# Patient Record
Sex: Male | Born: 1965 | Race: White | Hispanic: No | Marital: Married | Smoking: Current every day smoker
Health system: Southern US, Community
[De-identification: ages and names within clinical notes are randomized; demographics above are authoritative.]

## PROBLEM LIST (undated history)

## (undated) DIAGNOSIS — I6529 Occlusion and stenosis of unspecified carotid artery: Secondary | ICD-10-CM

## (undated) DIAGNOSIS — E7800 Pure hypercholesterolemia, unspecified: Secondary | ICD-10-CM

## (undated) DIAGNOSIS — Z87442 Personal history of urinary calculi: Secondary | ICD-10-CM

## (undated) DIAGNOSIS — M1040 Other secondary gout, unspecified site: Secondary | ICD-10-CM

## (undated) DIAGNOSIS — M4802 Spinal stenosis, cervical region: Secondary | ICD-10-CM

## (undated) DIAGNOSIS — N210 Calculus in bladder: Secondary | ICD-10-CM

## (undated) DIAGNOSIS — Z720 Tobacco use: Secondary | ICD-10-CM

## (undated) HISTORY — PX: TONSILLECTOMY: SUR1361

---

## 2007-08-08 ENCOUNTER — Ambulatory Visit: Payer: Self-pay | Admitting: Orthopedic Surgery

## 2010-01-18 ENCOUNTER — Ambulatory Visit: Payer: Self-pay | Admitting: Internal Medicine

## 2010-01-19 ENCOUNTER — Ambulatory Visit: Payer: Self-pay | Admitting: Urology

## 2010-01-20 ENCOUNTER — Ambulatory Visit: Payer: Self-pay | Admitting: Urology

## 2012-02-22 IMAGING — CT CT ABD-PELV W/O CM
1 of 2 series · 15 of 32 positions shown, 19 images · non-contrast
Comparison: none

REASON FOR EXAM: CR [id number]  left flank pain vomiting hematuria
COMMENTS:

PROCEDURE:     KCT - KCT ABDOMEN/PELVIS WO  - January 18, 2010  [DATE]
RESULT:     CT abdomen and pelvis dated 01/18/2010
TECHNIQUE: Helical noncontrasted 3 mm sections were obtained from the lung
bases through the pubic symphysis.

[Series 2: stone 3.0 i40f · axial · 0.76mm/px · z∈[-1193,-797]mm · 15 of 150 slices shown, 19 images]
[im 12/150  soft-tissue]
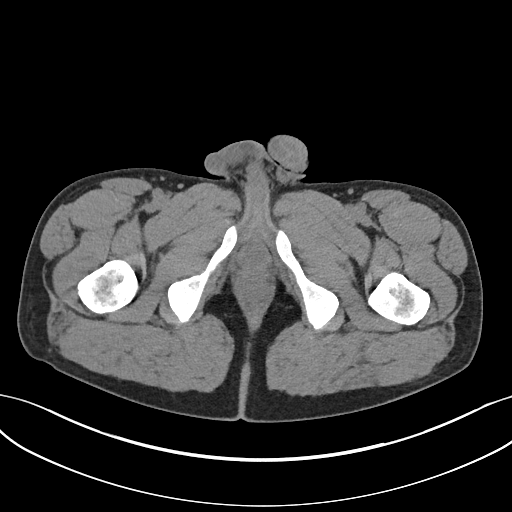
[im 12/150  bone]
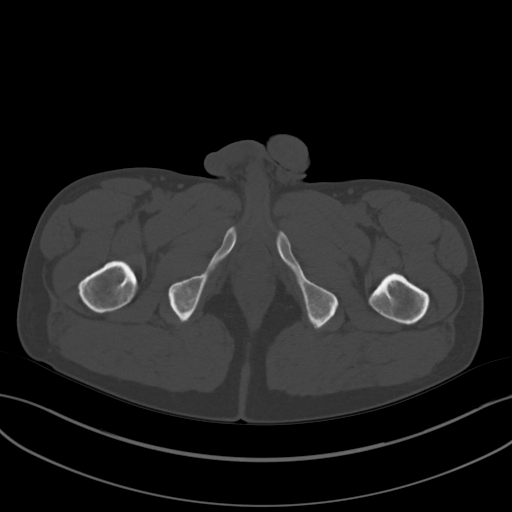
[im 23/150  soft-tissue]
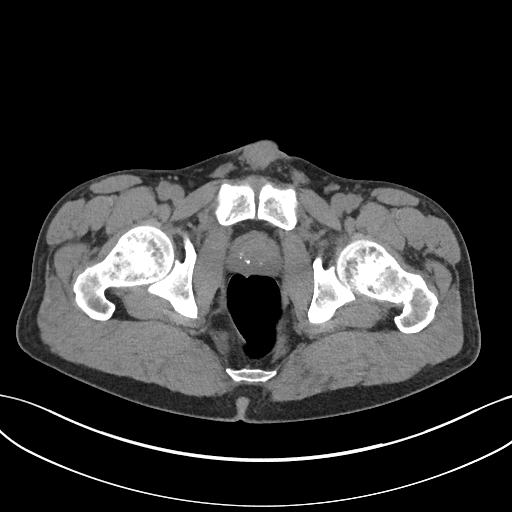
[im 34/150  soft-tissue]
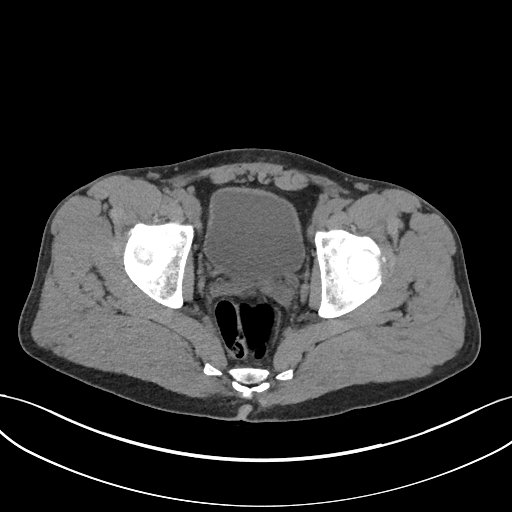
[im 45/150  soft-tissue]
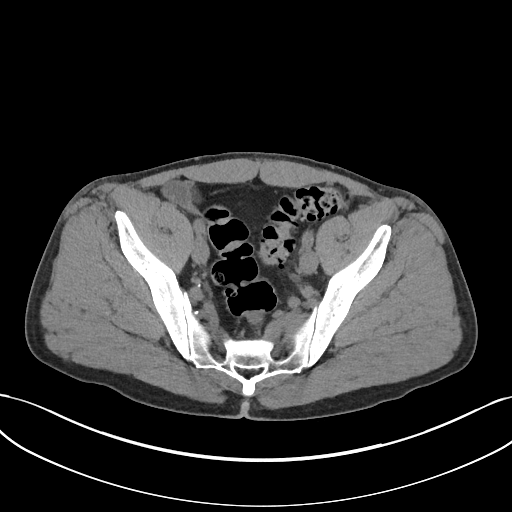
[im 56/150  soft-tissue]
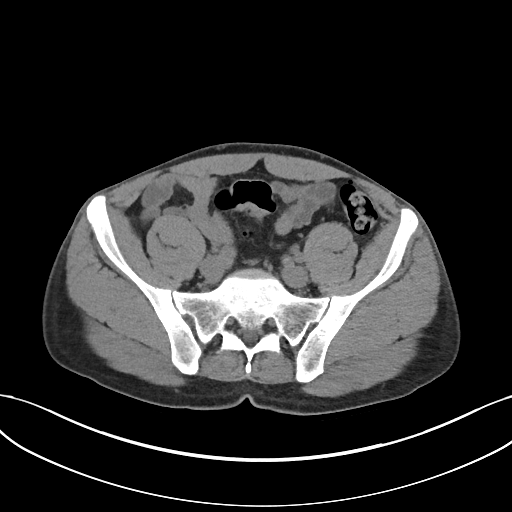
[im 67/150  soft-tissue]
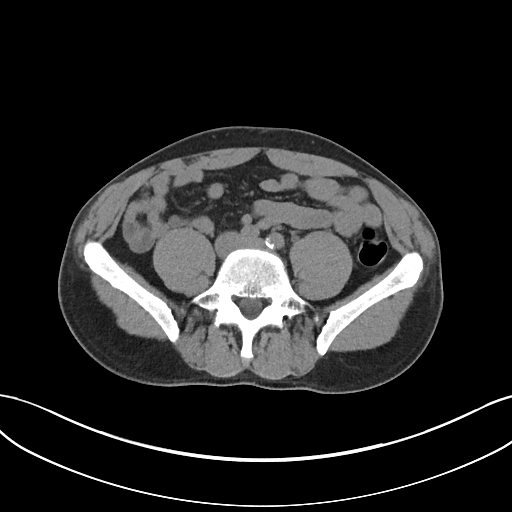
[im 78/150  soft-tissue]
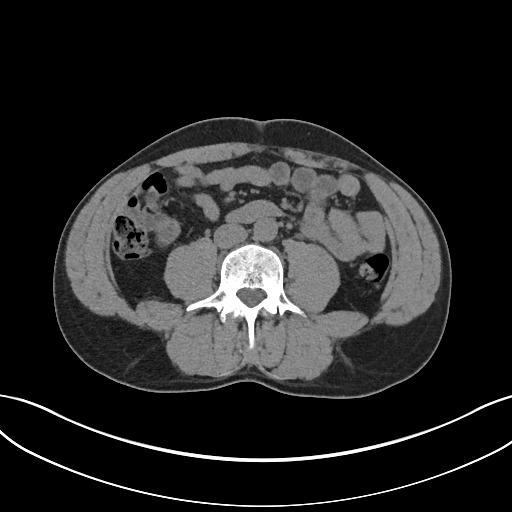
[im 89/150  soft-tissue]
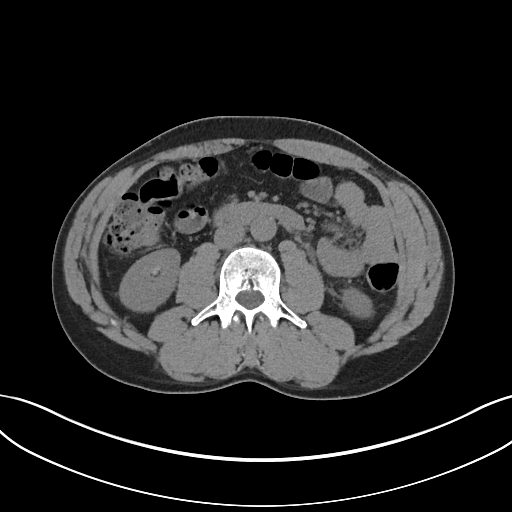
[im 100/150  soft-tissue]
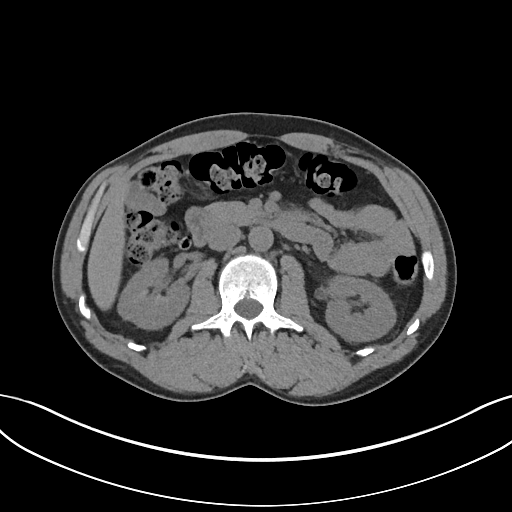
[im 100/150  bone]
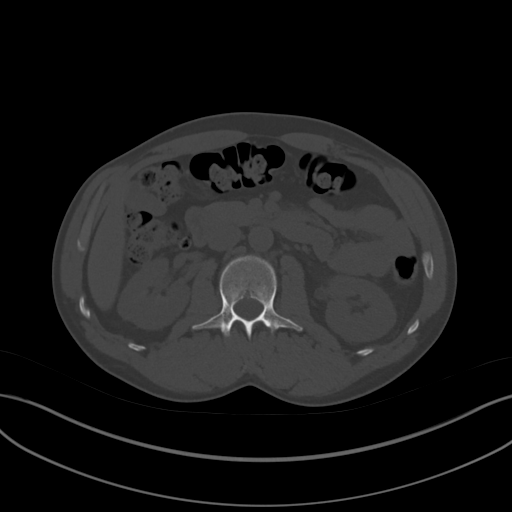
[im 111/150  soft-tissue]
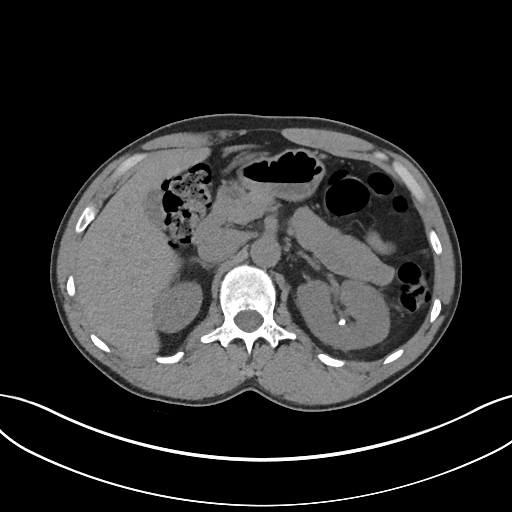
[im 122/150  soft-tissue]
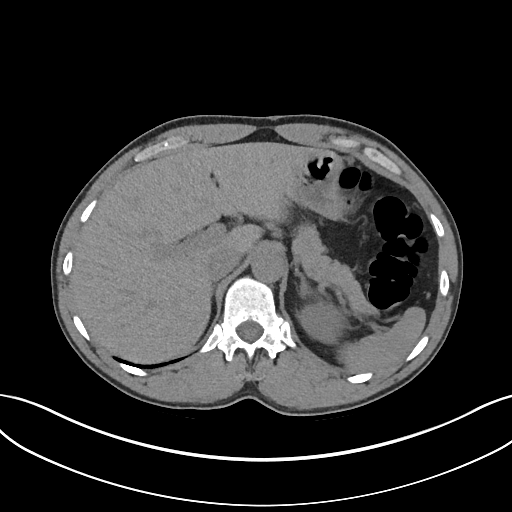
[im 127/150  lung]
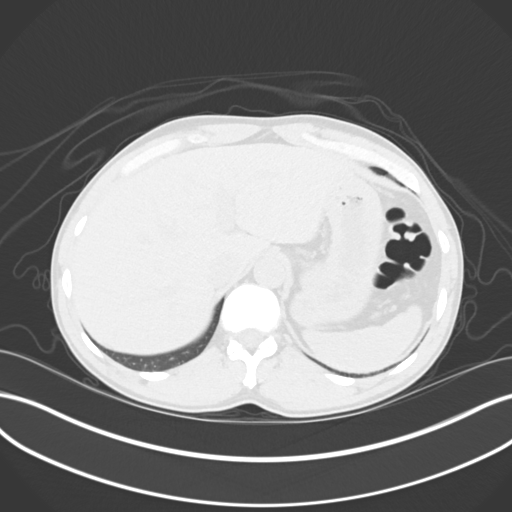
[im 133/150  soft-tissue]
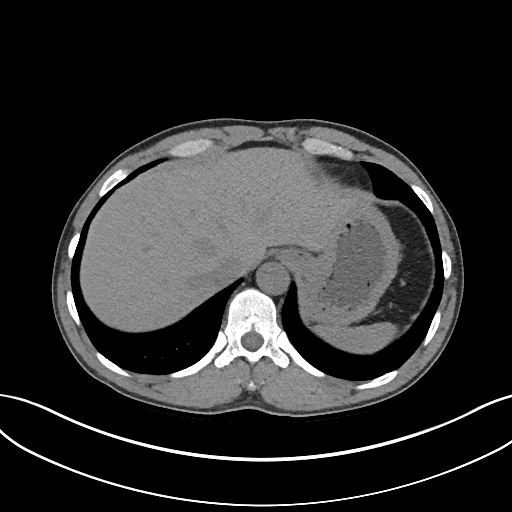
[im 133/150  lung]
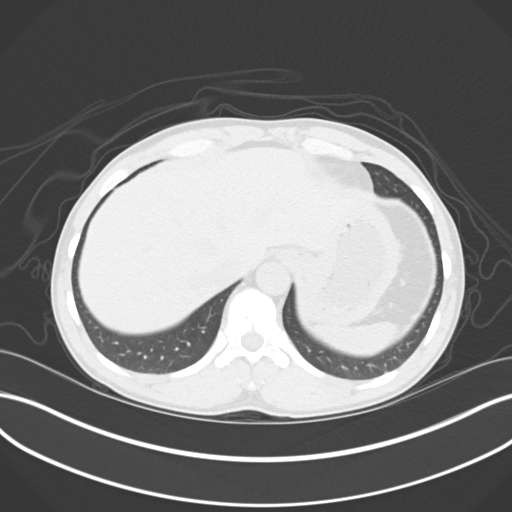
[im 138/150  lung]
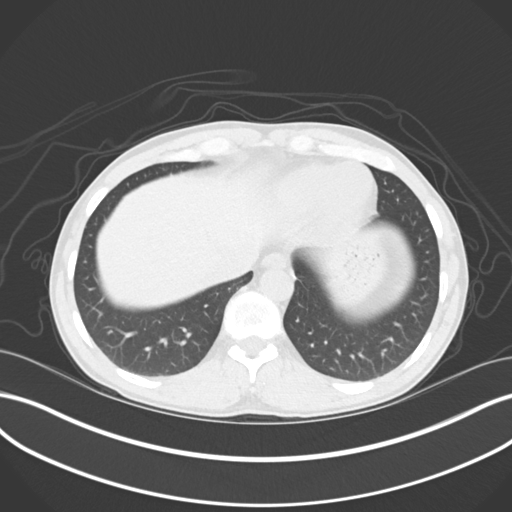
[im 144/150  soft-tissue]
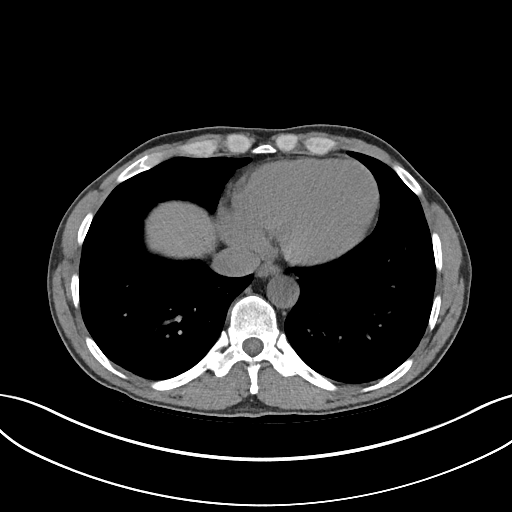
[im 144/150  lung]
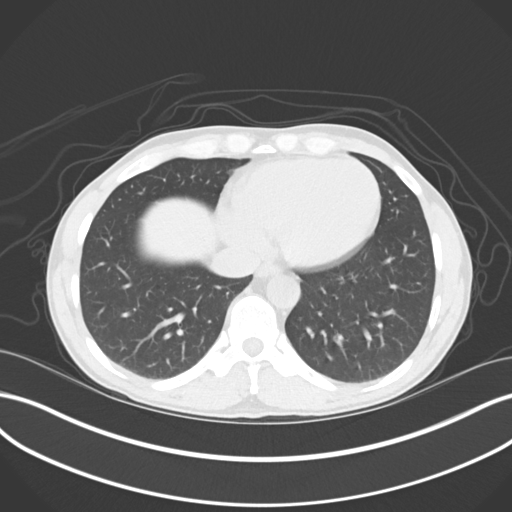

[15 of 32 positions shown; findings below may reference images not displayed]

FINDINGS: The lung bases are grossly unremarkable.

Evaluation of the left kidney demonstrates multiple nonobstructing medullary
calculi the largest measuring 6.9 mm. Mild pelviectasis is identified as
well as mild hydroureter of the proximal ureter. Within the proximal ureter
an 8.1 mm calculus is identified. Multiple nonobstructing medullary calculi
identified within the right kidney evidence of hydronephrosis hydroureter
nor ureteral lithiasis.

Noncontrast evaluation of the liver, spleen, adrenals, pancreas is
unremarkable. There is no CT evidence of bowel obstruction nor secondary
signs reflecting enteritis, colitis common diverticulitis nor appendicitis.
Within the central base of the urinary bladder 5.3 mm calculus is identified.

There is no CT evidence of an abdominal aortic aneurysm.
IMPRESSION: Proximal left ureteral calculus which appears be just
distal to the ureteral pelvic junction measuring 8.1 mm in diameter with
associated mild obstructive uropathy.
2. Nonobstructing bilateral medullary calculi.

## 2012-02-23 IMAGING — CR DG ABDOMEN 1V
1 series · 2 of 2 positions shown · non-contrast
Comparison: none

REASON FOR EXAM: calculus of ureter
COMMENTS:

PROCEDURE:     MDR - MDR KIDNEY URETER BLADDER  - January 19, 2010 [DATE]
RESULT:     Comparison: CT of the abdomen and pelvis 01/18/2010

[Series 1: view not recorded · 0.17mm/px · 2 of 2 slices shown]
[im 1/2]
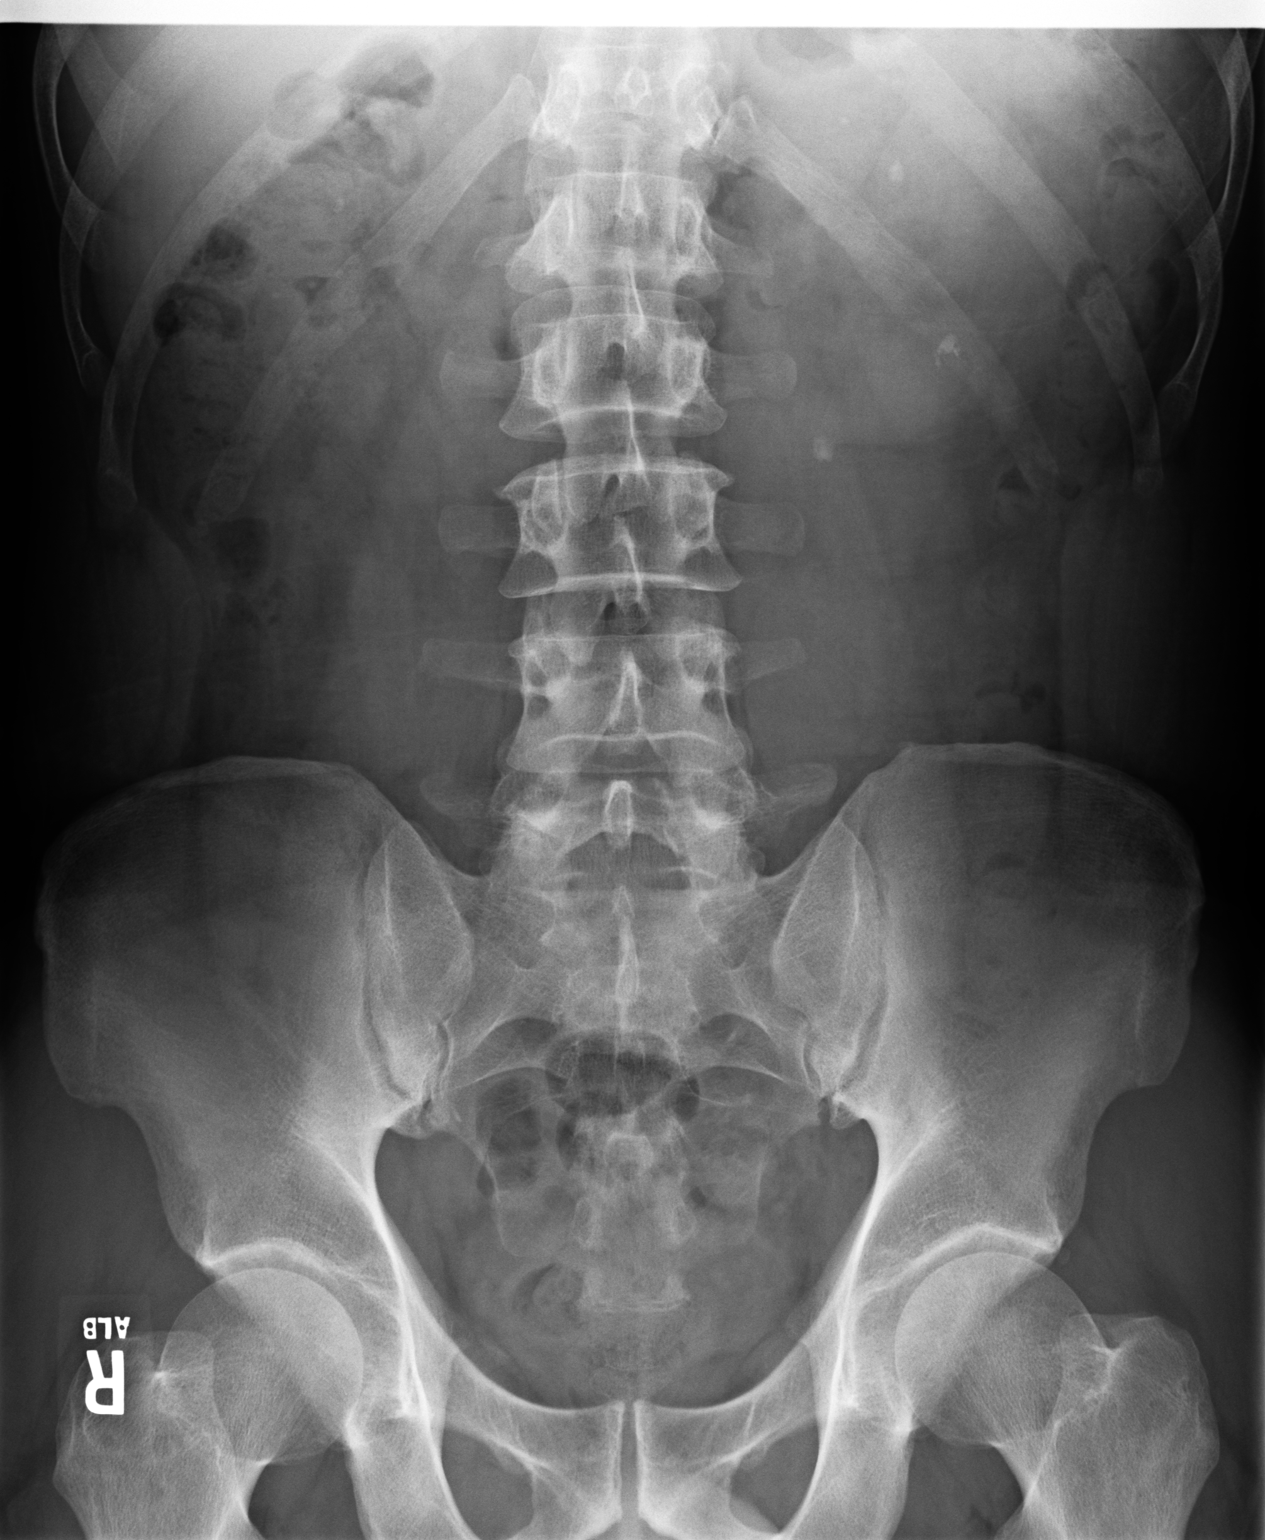
[im 2/2]
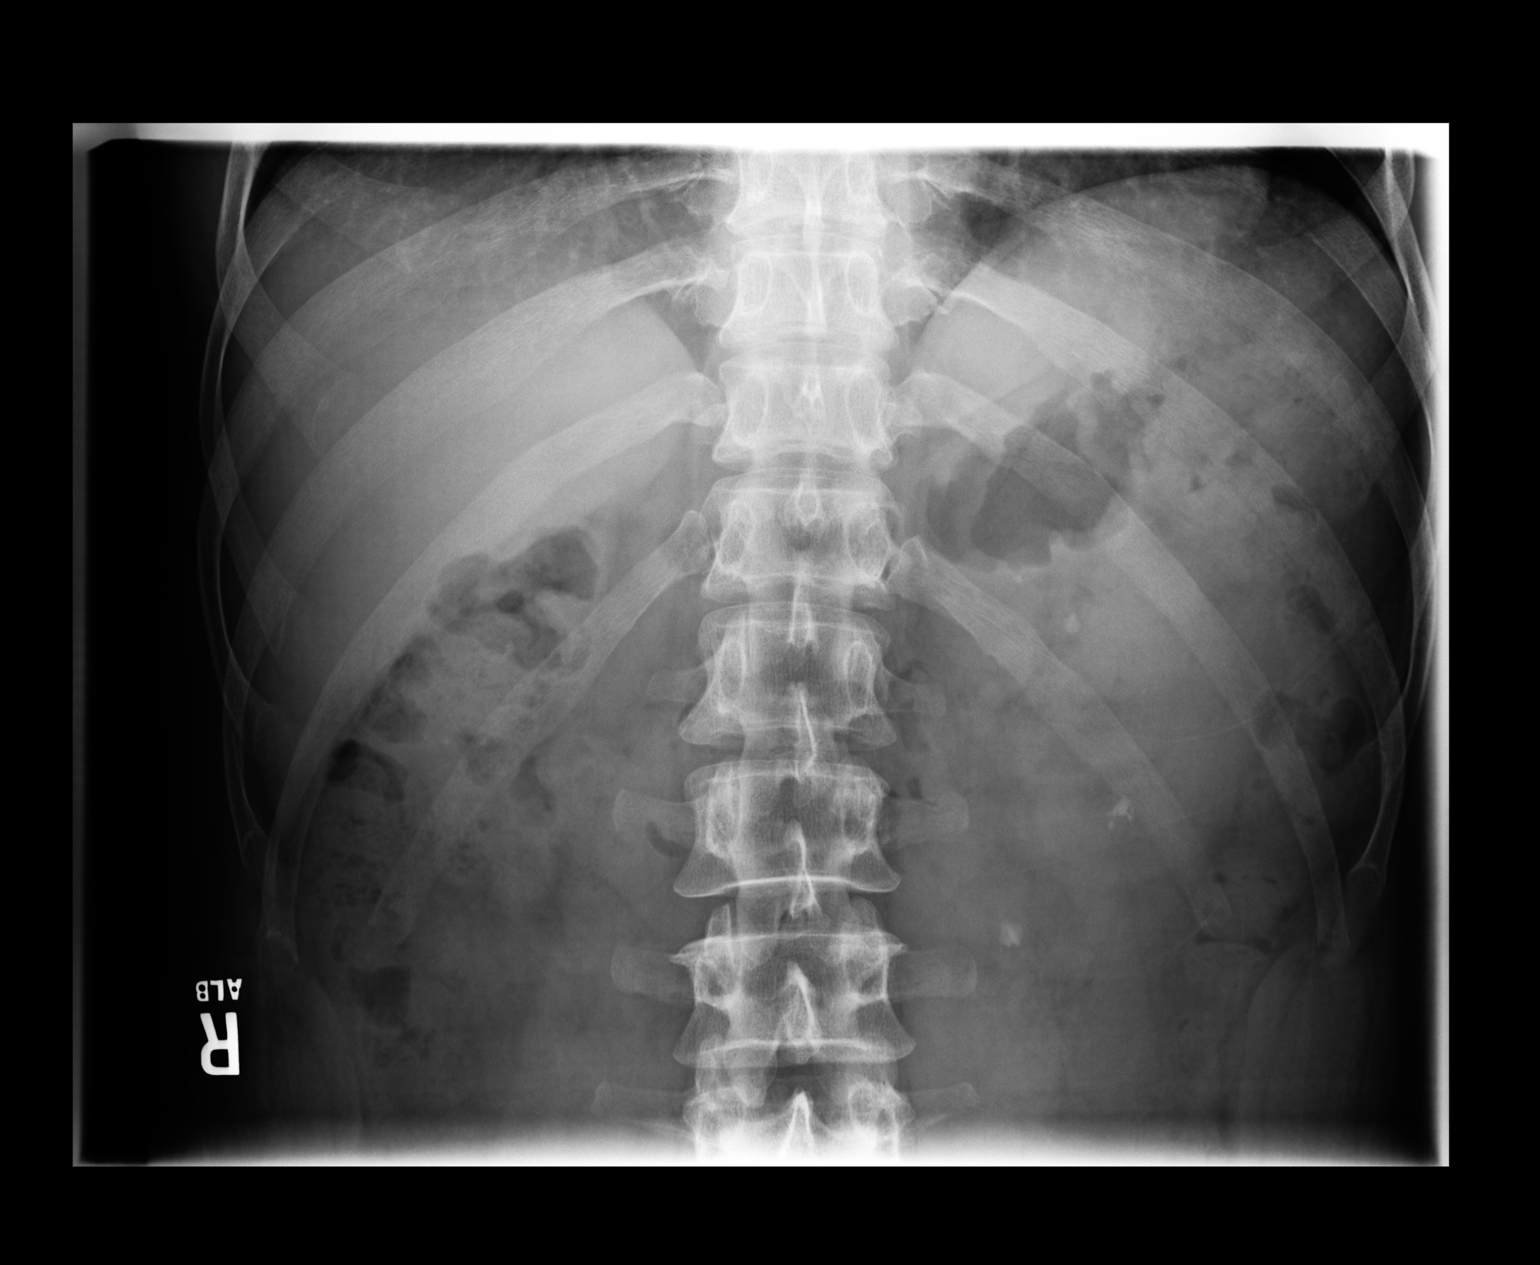

[2 of 2 positions shown; findings below may reference images not displayed]

FINDINGS: There are multiple calculi overlying the renal shadows bilaterally, similar
to prior. Again demonstrated is a 6 mm calculus overlying the expected
location of the proximal left ureter, as seen on the prior CT.

Nonobstructed bowel gas pattern.
IMPRESSION: Bilateral nephrolithiasis. Calculus in the proximal left ureter appears
unchanged.

## 2014-06-29 ENCOUNTER — Other Ambulatory Visit: Payer: Self-pay

## 2015-08-18 ENCOUNTER — Ambulatory Visit (INDEPENDENT_AMBULATORY_CARE_PROVIDER_SITE_OTHER): Payer: 59

## 2015-08-18 ENCOUNTER — Encounter: Payer: Self-pay | Admitting: Emergency Medicine

## 2015-08-18 ENCOUNTER — Ambulatory Visit
Admission: EM | Admit: 2015-08-18 | Discharge: 2015-08-18 | Disposition: A | Discharge status: Home / Self Care | Payer: 59 | Attending: Emergency Medicine | Admitting: Emergency Medicine

## 2015-08-18 DIAGNOSIS — S4991XA Unspecified injury of right shoulder and upper arm, initial encounter: Secondary | ICD-10-CM | POA: Diagnosis not present

## 2015-08-18 DIAGNOSIS — M25511 Pain in right shoulder: Secondary | ICD-10-CM | POA: Diagnosis not present

## 2015-08-18 MED ORDER — KETOROLAC TROMETHAMINE 60 MG/2ML IM SOLN
60.0000 mg | Freq: Once | INTRAMUSCULAR | Status: AC
  Administered 2015-08-18: 60 mg via INTRAMUSCULAR

## 2015-08-18 MED ORDER — HYDROCODONE-ACETAMINOPHEN 5-325 MG PO TABS: 2.0000 | ORAL_TABLET | ORAL | 0 refills | Status: DC | PRN

## 2015-08-18 MED ORDER — IBUPROFEN 800 MG PO TABS: 800.0000 mg | ORAL_TABLET | Freq: Three times a day (TID) | ORAL | 0 refills | Status: DC

## 2015-08-18 NOTE — Discharge Instructions (Signed)
Follow-up with Dr. Gavin Potters at the [facility] orthopedics clinic within a week. In the meantime you may take 800 mg ibuprofen with 1 g of Tylenol 3 times a day this is an effective combination for pain. Take the Norco for severe pain. Do not take both the Tylenol and the Norco, as they both have Tylenol in them and too much can hurt your liver. Do not exceed 4 g of Tylenol from all sources in one day. The Norco has 325 mg of Tylenol and it per pill. Go to the ER for pain not controlled with medications, fever above 100.4, weakness that is not caused by pain, or other concerns.

## 2015-08-18 NOTE — ED Triage Notes (Signed)
Patient states that he fell 2 days ago and is having pain in his left shoulder. Patient denies hitting his head.

## 2015-08-18 NOTE — ED Provider Notes (Signed)
HPI  SUBJECTIVE:  Victor Mathews is a 50 y.o. male who presents with constant achy left shoulder pain that becomes stabbing with movement for the past 2 days. Patient states that he slipped and fell, landing on his hands behind him and also twisting his shoulder. He reports limitation of motion secondary to pain and the pain is worse with initiating movement , forward flexion. No alleviating factors. He has tried ibuprofen 400 mg 4 times a day without much improvement. No numbness, tingling, erythema, swelling, bruising, weakness. He did not land on her shoulder. Denies head injury or loss consciousness. No previous history of injury to the shoulder. He is a smoker. Past medical history of kidney stones, but no history of diabetes, hypertension, rotator cuff injury, dislocation. PMD: None. Orthopedist none.    History reviewed. No pertinent past medical history.  History reviewed. No pertinent surgical history.  History reviewed. No pertinent family history.  Social History  Substance Use Topics  . Smoking status: Current Every Day Smoker    Types: Cigarettes  . Smokeless tobacco: Never Used  . Alcohol use Yes    No current facility-administered medications for this encounter.   Current Outpatient Prescriptions:  .  HYDROcodone-acetaminophen (NORCO/VICODIN) 5-325 MG tablet, Take 2 tablets by mouth every 4 (four) hours as needed for moderate pain., Disp: 20 tablet, Rfl: 0 .  ibuprofen (ADVIL,MOTRIN) 800 MG tablet, Take 1 tablet (800 mg total) by mouth 3 (three) times daily., Disp: 30 tablet, Rfl: 0  No Known Allergies   ROS  As noted in HPI.   Physical Exam  BP 128/78 (BP Location: Right Arm)   Pulse 85   Temp 97.2 F (36.2 C) (Tympanic)   Resp 16   Ht 5\' 10"  (1.778 m)   Wt 150 lb (68 kg)   SpO2 100%   BMI 21.52 kg/m   Constitutional: Well developed, well nourished, no acute distress Eyes:  EOMI, conjunctiva normal bilaterally HENT: Normocephalic, atraumatic,mucus  membranes moist Respiratory: Normal inspiratory effort Cardiovascular: Normal rate GI: nondistended skin: No rash, skin intact Musculoskeletal: L shoulder with ROM somewhat limited, no tenderness over entire joint although he states that the pain is "deep" and located inferior to the [facility] joint and along the lateral part of the shoulder. Drop test painful but negative,  clavicle NT , A/C joint NT , scapula NT , proximal humerus NT , shoulder joint NT  , Motor strength decreased at shoulder due to pain, Sensation intact LT over deltoid region, distal NVI with hand on affected side having intact sensation and strength in the distribution of the median, radial, and ulnar nerve. no pain with internal rotation, pain  with external rotation, negative tenderness in bicipital groove,  negative empty can test negative liftoff test, guarding with abduction/external rotation this exam limited been unable to appreciate any instability. Grip Strength equal bilaterally. RP 2+.. Neurologic: Alert & oriented x 3, no focal neuro deficits Psychiatric: Speech and behavior appropriate   ED Course   Medications  ketorolac (TORADOL) injection 60 mg (60 mg Intramuscular Given 08/18/15 0923)    Orders Placed This Encounter  Procedures  . DG Shoulder Left    Standing Status:   Standing    Number of Occurrences:   1    Order Specific Question:   Reason for Exam (SYMPTOM  OR DIAGNOSIS REQUIRED)    Answer:   r/o fx dislocation avulsion fx  . Ambulatory referral to Orthopedic Surgery    Referral Priority:   Urgent  Referral Type:   Surgical    Referral Reason:   Specialty Services Required    Referred to Provider:   Erin Sons, MD    Requested Specialty:   Orthopedic Surgery    Number of Visits Requested:   1    No results found for this or any previous visit (from the past 24 hour(s)). Dg Shoulder Left  Result Date: 08/18/2015 CLINICAL DATA:  Slipped, fell down steps 2 nights ago. Left posterior shoulder  pain. EXAM: LEFT SHOULDER - 2+ VIEW COMPARISON:  MRI 08/08/2007 FINDINGS: There is no evidence of fracture or dislocation. There is no evidence of arthropathy or other focal bone abnormality. Soft tissues are unremarkable. IMPRESSION: Negative. Electronically Signed   By: Charlett Nose M.D.   On: 08/18/2015 09:41    ED Clinical Impression  Shoulder pain, acute, right  Shoulder injury, right, initial encounter   ED Assessment/Plan  Patient has pain particularly with initiating movement, however no weakness is appreciated.think hat he has either bursitis or a sprain of the rotator cuff. No complete tear of the rotator cuff. We'll check x-ray to rule out any fracture, dislocation, avulsion fracture. Plan to send home with ibuprofen 800 mg 3 times a day with milligram of Tylenol, Norco, orthopedic referral. Giving shot of Toradol here. Advised patient that he will need to find a primary care physician of his choice for routine medical care  Reviewed imaging independently. Normal shoulder. See radiology report for details   Plan as above. Discussed imaging, MDM, plan and followup with patient . Discussed sn/sx that should prompt return to the ED. Patient  agrees with plan.   *This clinic note was created using Dragon dictation software. Therefore, there may be occasional mistakes despite careful proofreading.  ?   Domenick Gong, MD 08/18/15 1021

## 2017-02-28 ENCOUNTER — Encounter: Payer: Self-pay | Admitting: Emergency Medicine

## 2017-02-28 ENCOUNTER — Ambulatory Visit
Admission: EM | Admit: 2017-02-28 | Discharge: 2017-02-28 | Disposition: A | Discharge status: Home / Self Care | Payer: BC Managed Care – PPO | Attending: Family Medicine | Admitting: Family Medicine

## 2017-02-28 ENCOUNTER — Other Ambulatory Visit: Payer: Self-pay

## 2017-02-28 DIAGNOSIS — J0110 Acute frontal sinusitis, unspecified: Secondary | ICD-10-CM | POA: Diagnosis not present

## 2017-02-28 DIAGNOSIS — H6691 Otitis media, unspecified, right ear: Secondary | ICD-10-CM

## 2017-02-28 DIAGNOSIS — R05 Cough: Secondary | ICD-10-CM | POA: Diagnosis not present

## 2017-02-28 DIAGNOSIS — R059 Cough, unspecified: Secondary | ICD-10-CM

## 2017-02-28 MED ORDER — AMOXICILLIN-POT CLAVULANATE 875-125 MG PO TABS: 1.0000 | ORAL_TABLET | Freq: Two times a day (BID) | ORAL | 0 refills | Status: DC

## 2017-02-28 MED ORDER — HYDROCOD POLST-CPM POLST ER 10-8 MG/5ML PO SUER: 5.0000 mL | Freq: Every evening | ORAL | 0 refills | Status: DC | PRN

## 2017-02-28 MED ORDER — BENZONATATE 100 MG PO CAPS: 100.0000 mg | ORAL_CAPSULE | Freq: Three times a day (TID) | ORAL | 0 refills | Status: DC | PRN

## 2017-02-28 NOTE — Discharge Instructions (Signed)
Take medication as prescribed. Rest. Drink plenty of fluids.  ° °Follow up with your primary care physician this week as needed. Return to Urgent care for new or worsening concerns.  ° °

## 2017-02-28 NOTE — ED Provider Notes (Signed)
[facility] ____________________________________________  Time seen: Approximately 5:18 PM  I have reviewed the triage vital signs and the nursing notes.   HISTORY  Chief Complaint Cough; Generalized Body Aches; and Fever  HPI Victor Mathews is a 52 y.o. male presenting with wife at bedside for evaluation of cough, congestion, sinus pressure and bilateral ear pain that is been present for 1 week.  Patient reports 2 weeks ago he had quick onset of cough, congestion, chills, body aches and felt like he had the flu.  Reports he had been around other people that had the flu and that his wife had a similar. No recent fevers.   Patient reports last week he started to improve but then towards the end of the week that he felt like the nasal congestion worsened again.  Has had continued cough, but feels like the cough is from postnasal drainage is been worse at night.  States does not feel like he has chest congestion.  States sinus pressure and bilateral ear fluid sensation.  States over the last 2 days he has had increase of right ear pain is worse than left and history of previous ear infections with similar presentation.  Has been taken intermittent Mucinex and Tylenol.  No antipyretics taken prior to arrival.  Denies associated chest pain, shortness of breath or hemoptysis.  Has continue to remain active. Continues to drink fluids well, slight decrease in appetite.  Also some accompanying sore throat. Denies chest pain, shortness of breath, abdominal pain, dysuria, extremity pain, extremity swelling or rash. Denies recent sickness. Denies recent antibiotic use.    History reviewed. No pertinent past medical history.  There are no active problems to display for this patient.   Past Surgical History:  Procedure Laterality Date  . TONSILLECTOMY       No current facility-administered medications for this encounter.   Current Outpatient Medications:  .  amoxicillin-clavulanate  (AUGMENTIN) 875-125 MG tablet, Take 1 tablet by mouth every 12 (twelve) hours., Disp: 20 tablet, Rfl: 0 .  benzonatate (TESSALON PERLES) 100 MG capsule, Take 1 capsule (100 mg total) by mouth 3 (three) times daily as needed for cough., Disp: 15 capsule, Rfl: 0 .  chlorpheniramine-HYDROcodone (TUSSIONEX PENNKINETIC ER) 10-8 MG/5ML SUER, Take 5 mLs by mouth at bedtime as needed for cough. do not drive or operate machinery while taking as can cause drowsiness., Disp: 75 mL, Rfl: 0 .  ibuprofen (ADVIL,MOTRIN) 800 MG tablet, Take 1 tablet (800 mg total) by mouth 3 (three) times daily., Disp: 30 tablet, Rfl: 0  Allergies Patient has no known allergies.  History reviewed. No pertinent family history.  Social History Social History   Tobacco Use  . Smoking status: Current Every Day Smoker    Types: Cigarettes  . Smokeless tobacco: Never Used  Substance Use Topics  . Alcohol use: Yes  . Drug use: No    Review of Systems Constitutional: As above.  ENT: As above.  Cardiovascular: Denies chest pain. Respiratory: Denies shortness of breath. Gastrointestinal: No abdominal pain.  No nausea, no vomiting.  No diarrhea.   Genitourinary: Negative for dysuria. Musculoskeletal: Negative for back pain. Skin: Negative for rash.   ____________________________________________   PHYSICAL EXAM:  VITAL SIGNS: ED Triage Vitals  Enc Vitals Group     BP 02/28/17 1632 (!) 141/80     Pulse Rate 02/28/17 1632 (!) 102     Resp 02/28/17 1632 16     Temp 02/28/17 1632 98.9 F (37.2 C)  Temp Source 02/28/17 1632 Oral     SpO2 02/28/17 1632 96 %     Weight 02/28/17 1630 167 lb (75.8 kg)     Height 02/28/17 1630 5\' 10"  (1.778 m)     Head Circumference --      Peak Flow --      Pain Score 02/28/17 1630 8     Pain Loc --      Pain Edu? --      Excl. in GC? --     Constitutional: Alert and oriented. Well appearing and in no acute distress. Eyes: Conjunctivae are normal.  Head: Atraumatic.Mild   tenderness to palpation bilateral frontal and mild to moderate maxillary sinuses. No swelling. No erythema.   Ears: Right: Nontender, normal canal, moderate erythema and bulging TM.  Left: Nontender, normal canal, mild erythema, mild effusion present, otherwise normal TM.  Nose: nasal congestion with bilateral nasal turbinate erythema and edema.   Mouth/Throat: Mucous membranes are moist.  Oropharynx non-erythematous.No tonsillar swelling or exudate.  Neck: No stridor.  No cervical spine tenderness to palpation. Hematological/Lymphatic/Immunilogical: No cervical lymphadenopathy. Cardiovascular: Normal rate, regular rhythm. Grossly normal heart sounds.  Good peripheral circulation. Respiratory: Normal respiratory effort.  No retractionsNo wheezes, rales or rhonchi. Good air movement.  Gastrointestinal: Soft and nontender.  Musculoskeletal: No cervical, thoracic or lumbar tenderness to palpation.  Neurologic:  Normal speech and language. No gross focal neurologic deficits are appreciated. No gait instability. Skin:  Skin is warm, dry and intact. No rash noted. Psychiatric: Mood and affect are normal. Speech and behavior are normal.  ___________________________________________   LABS (all labs ordered are listed, but only abnormal results are displayed)  Labs Reviewed - No data to display ___________________________________________ _ INITIAL IMPRESSION / ASSESSMENT AND PLAN / ED COURSE  Pertinent labs & imaging results that were available during my care of the patient were reviewed by me and considered in my medical decision making (see chart for details).  Well-appearing patient.  No acute distress.  Lungs clear throughout.  Suspect recent influenza-like illness.  Will treat patient with oral Augmentin concern for sinusitis and right otitis.  Parent Tessalon Perles and Tussionex.  Encourage rest, fluids, supportive care.Discussed indication, risks and benefits of medications with  patient.  Discussed follow up with Primary care physician this week. Discussed follow up and return parameters including no resolution or any worsening concerns. Patient verbalized understanding and agreed to plan.   ____________________________________________   FINAL CLINICAL IMPRESSION(S) / ED DIAGNOSES  Final diagnoses:  Right otitis media, unspecified otitis media type  Acute frontal sinusitis, recurrence not specified  Cough     ED Discharge Orders        Ordered    chlorpheniramine-HYDROcodone (TUSSIONEX PENNKINETIC ER) 10-8 MG/5ML SUER  At bedtime PRN     02/28/17 1716    benzonatate (TESSALON PERLES) 100 MG capsule  3 times daily PRN     02/28/17 1716    amoxicillin-clavulanate (AUGMENTIN) 875-125 MG tablet  Every 12 hours     02/28/17 1716       Note: This dictation was prepared with Dragon dictation along with smaller phrase technology. Any transcriptional errors that result from this process are unintentional.         Renford Dills, NP 02/28/17 1750

## 2017-02-28 NOTE — ED Triage Notes (Signed)
Patient c/o runny nose, cough, HAs, fever and bodyaches off and on for 14 days.  Patient reports bilateral ear pain that started yesterday.

## 2017-05-05 ENCOUNTER — Other Ambulatory Visit: Payer: Self-pay

## 2017-05-05 ENCOUNTER — Ambulatory Visit
Admission: EM | Admit: 2017-05-05 | Discharge: 2017-05-05 | Disposition: A | Discharge status: Home / Self Care | Payer: BC Managed Care – PPO | Attending: Family Medicine | Admitting: Family Medicine

## 2017-05-05 DIAGNOSIS — J069 Acute upper respiratory infection, unspecified: Secondary | ICD-10-CM

## 2017-05-05 MED ORDER — FEXOFENADINE-PSEUDOEPHED ER 60-120 MG PO TB12: 1.0000 | ORAL_TABLET | Freq: Two times a day (BID) | ORAL | 0 refills | Status: DC

## 2017-05-05 MED ORDER — FLUTICASONE PROPIONATE 50 MCG/ACT NA SUSP: 2.0000 | Freq: Every day | NASAL | 0 refills | Status: DC

## 2017-05-05 MED ORDER — BENZONATATE 200 MG PO CAPS: ORAL_CAPSULE | ORAL | 0 refills | Status: DC

## 2017-05-05 MED ORDER — HYDROCOD POLST-CPM POLST ER 10-8 MG/5ML PO SUER: 5.0000 mL | Freq: Two times a day (BID) | ORAL | 0 refills | Status: DC

## 2017-05-05 NOTE — ED Provider Notes (Signed)
[facility]    CSN: [id number] Arrival date & time: 05/05/17  4098     History   Chief Complaint Chief Complaint  Patient presents with  . Facial Pain  . Otalgia  . Appointment    HPI Victor Mathews is a 52 y.o. male.   HPI  52 year old male presents with facial pain ear pain sore throat and cough which is worsening but has had no fever.  He has had the symptoms for 3 days now.  He states it is the same as his symptoms he had when he had the "flu" in January.  Has been taken nonsteroidals.  Works in [employer].  He states that 1 of his coworkers was sick 3 days before he got sx and a coworker's  wife was sick 3 days before he had sx.           History reviewed. No pertinent past medical history.  There are no active problems to display for this patient.   Past Surgical History:  Procedure Laterality Date  . TONSILLECTOMY         Home Medications    Prior to Admission medications   Medication Sig Start Date End Date Taking? Authorizing Provider  benzonatate (TESSALON) 200 MG capsule Take one cap TID PRN cough 05/05/17   Lutricia Feil, PA-C  chlorpheniramine-HYDROcodone [facility] ER) 10-8 MG/5ML SUER Take 5 mLs by mouth 2 (two) times daily. 05/05/17   Lutricia Feil, PA-C  fexofenadine-pseudoephedrine (ALLEGRA-D) 60-120 MG 12 hr tablet Take 1 tablet by mouth every 12 (twelve) hours. 05/05/17   Lutricia Feil, PA-C  fluticasone (FLONASE) 50 MCG/ACT nasal spray Place 2 sprays into both nostrils daily. 05/05/17   Lutricia Feil, PA-C    Family History History reviewed. No pertinent family history.  Social History Social History   Tobacco Use  . Smoking status: Current Every Day Smoker    Packs/day: 1.00    Types: Cigarettes  . Smokeless tobacco: Never Used  Substance Use Topics  . Alcohol use: Yes    Comment: social  . Drug use: No     Allergies   Patient has no known allergies.   Review of Systems Review of  Systems  Constitutional: Positive for activity change, chills and fatigue. Negative for fever.  HENT: Positive for congestion, ear pain, rhinorrhea, sinus pressure and sinus pain.   Respiratory: Positive for cough.   All other systems reviewed and are negative.    Physical Exam Triage Vital Signs ED Triage Vitals  Enc Vitals Group     BP 05/05/17 0935 (!) 131/91     Pulse Rate 05/05/17 0935 99     Resp 05/05/17 0935 18     Temp 05/05/17 0935 98.2 F (36.8 C)     Temp Source 05/05/17 0935 Oral     SpO2 05/05/17 0935 99 %     Weight 05/05/17 0936 160 lb (72.6 kg)     Height 05/05/17 0936 5\' 10"  (1.778 m)     Head Circumference --      Peak Flow --      Pain Score 05/05/17 0935 3     Pain Loc --      Pain Edu? --      Excl. in GC? --    No data found.  Updated Vital Signs BP (!) 131/91 (BP Location: Right Arm)   Pulse 99   Temp 98.2 F (36.8 C) (Oral)   Resp 18   Ht  5\' 10"  (1.778 m)   Wt 160 lb (72.6 kg)   SpO2 99%   BMI 22.96 kg/m   Visual Acuity Right Eye Distance:   Left Eye Distance:   Bilateral Distance:    Right Eye Near:   Left Eye Near:    Bilateral Near:     Physical Exam  Constitutional: He is oriented to person, place, and time. He appears well-developed and well-nourished. No distress.  HENT:  Head: Normocephalic.  Right Ear: External ear normal.  Left Ear: External ear normal.  Nose: Nose normal.  Mouth/Throat: Oropharynx is clear and moist. No oropharyngeal exudate.  Eyes: Pupils are equal, round, and reactive to light. Right eye exhibits no discharge. Left eye exhibits no discharge.  Neck: Normal range of motion.  Pulmonary/Chest: Effort normal and breath sounds normal.  Musculoskeletal: Normal range of motion.  Lymphadenopathy:    He has cervical adenopathy.  Neurological: He is alert and oriented to person, place, and time.  Skin: Skin is warm and dry. He is not diaphoretic.  Psychiatric: He has a normal mood and affect. His behavior is  normal. Judgment and thought content normal.     UC Treatments / Results  Labs (all labs ordered are listed, but only abnormal results are displayed) Labs Reviewed - No data to display  EKG None Radiology No results found.  Procedures Procedures (including critical care time)  Medications Ordered in UC Medications - No data to display   Initial Impression / Assessment and Plan / UC Course  I have reviewed the triage vital signs and the nursing notes.  Pertinent labs & imaging results that were available during my care of the patient were reviewed by me and considered in my medical decision making (see chart for details).     Plan: 1. Test/x-ray results and diagnosis reviewed with patient 2. rx as per orders; risks, benefits, potential side effects reviewed with patient 3. Recommend supportive treatment with rest and fluids.  This is likely a viral illness does not require antibiotic use at this point.  Will provide him with cough suppressants.  Also encouraged the use of Flonase and Allegra-D.  If he is not improving he should return to our clinic for further evaluation and treatment. 4. F/u prn if symptoms worsen or don't improve   Final Clinical Impressions(s) / UC Diagnoses   Final diagnoses:  Upper respiratory tract infection, unspecified type    ED Discharge Orders        Ordered    chlorpheniramine-HYDROcodone (TUSSIONEX PENNKINETIC ER) 10-8 MG/5ML SUER  2 times daily     05/05/17 1020    benzonatate (TESSALON) 200 MG capsule     05/05/17 1020    fluticasone (FLONASE) 50 MCG/ACT nasal spray  Daily     05/05/17 1020    fexofenadine-pseudoephedrine (ALLEGRA-D) 60-120 MG 12 hr tablet  Every 12 hours     05/05/17 1020       Controlled Substance Prescriptions [place] Controlled Substance Registry consulted? Not Applicable   Lutricia FeilRoemer, Geraline Halberstadt P, PA-C 05/05/17 1035

## 2017-05-05 NOTE — ED Triage Notes (Signed)
Pt with facial pain, otalgia, sore throat. No fever. Sx x 3 days

## 2020-10-22 DIAGNOSIS — M4802 Spinal stenosis, cervical region: Secondary | ICD-10-CM | POA: Insufficient documentation

## 2020-10-24 DIAGNOSIS — E7800 Pure hypercholesterolemia, unspecified: Secondary | ICD-10-CM | POA: Insufficient documentation

## 2020-10-24 DIAGNOSIS — Z720 Tobacco use: Secondary | ICD-10-CM | POA: Insufficient documentation

## 2021-04-25 DIAGNOSIS — M1049 Other secondary gout, multiple sites: Secondary | ICD-10-CM | POA: Insufficient documentation

## 2021-09-06 ENCOUNTER — Ambulatory Visit
Admission: RE | Admit: 2021-09-06 | Discharge: 2021-09-06 | Disposition: A | Discharge status: Home / Self Care | Payer: BC Managed Care – PPO | Source: Ambulatory Visit | Attending: Urology | Admitting: Urology

## 2021-09-06 ENCOUNTER — Other Ambulatory Visit: Payer: Self-pay | Admitting: Urology

## 2021-09-06 ENCOUNTER — Other Ambulatory Visit
Admission: RE | Admit: 2021-09-06 | Discharge: 2021-09-06 | Disposition: A | Discharge status: Home / Self Care | Payer: BC Managed Care – PPO | Source: Home / Self Care | Attending: Urology | Admitting: Urology

## 2021-09-06 ENCOUNTER — Ambulatory Visit
Admission: RE | Admit: 2021-09-06 | Discharge: 2021-09-06 | Disposition: A | Discharge status: Home / Self Care | Payer: BC Managed Care – PPO | Attending: Urology | Admitting: Urology

## 2021-09-06 ENCOUNTER — Encounter: Payer: Self-pay | Admitting: Urology

## 2021-09-06 ENCOUNTER — Other Ambulatory Visit: Payer: Self-pay | Admitting: *Deleted

## 2021-09-06 ENCOUNTER — Ambulatory Visit: Payer: BC Managed Care – PPO | Admitting: Urology

## 2021-09-06 VITALS — BP 128/86 | HR 84 | Ht 70.0 in | Wt 173.0 lb

## 2021-09-06 DIAGNOSIS — N210 Calculus in bladder: Secondary | ICD-10-CM

## 2021-09-06 DIAGNOSIS — R10A0 Flank pain, unspecified side: Secondary | ICD-10-CM

## 2021-09-06 DIAGNOSIS — N200 Calculus of kidney: Secondary | ICD-10-CM

## 2021-09-06 DIAGNOSIS — N261 Atrophy of kidney (terminal): Secondary | ICD-10-CM | POA: Diagnosis not present

## 2021-09-06 DIAGNOSIS — N1330 Unspecified hydronephrosis: Secondary | ICD-10-CM | POA: Diagnosis not present

## 2021-09-06 DIAGNOSIS — R109 Unspecified abdominal pain: Secondary | ICD-10-CM | POA: Diagnosis not present

## 2021-09-06 DIAGNOSIS — N201 Calculus of ureter: Secondary | ICD-10-CM | POA: Diagnosis not present

## 2021-09-06 LAB — URINALYSIS, COMPLETE (UACMP) WITH MICROSCOPIC
Bilirubin Urine: NEGATIVE
Glucose, UA: NEGATIVE mg/dL
Ketones, ur: NEGATIVE mg/dL
Nitrite: NEGATIVE
Specific Gravity, Urine: 1.015 (ref 1.005–1.030)
Squamous Epithelial / LPF: NONE SEEN (ref 0–5)
pH: 5.5 (ref 5.0–8.0)

## 2021-09-06 MED ORDER — HYDROCODONE-ACETAMINOPHEN 5-325 MG PO TABS: 1.0000 | ORAL_TABLET | Freq: Four times a day (QID) | ORAL | 0 refills | Status: AC | PRN

## 2021-09-06 NOTE — H&P (View-Only) (Signed)
09/06/21 12:08 PM   Victor Mathews 1965/09/09 [id number]  CC: Dysuria, history of nephrolithiasis  HPI: 56 year old male who reports at least a week of burning with urination, as well as some back pain.  He has a history of nephrolithiasis previously requiring shockwave lithotripsy.  He denies any fevers or chills.  He reportedly was recommended to have left-sided percutaneous nephrolithotomy at [facility] in 2019, but he never followed up with them.  He has passed a number of small stones over the last week and he brought those to send for analysis today.  He thinks his stone type was calcium oxalate previously.  Urinalysis today relatively benign with 0-5 RBCs, 6-10 WBCs, rare bacteria, small leukocytes, nitrite negative.   Social History:  reports that he has been smoking cigarettes. He has been smoking an average of 1 pack per day. He has been exposed to tobacco smoke. He has never used smokeless tobacco. He reports current alcohol use. He reports that he does not use drugs.  Physical Exam: BP 128/86   Pulse 84   Ht 5\' 10"  (1.778 m)   Wt 173 lb (78.5 kg)   BMI 24.82 kg/m    Constitutional:  Alert and oriented, No acute distress. Cardiovascular: No clubbing, cyanosis, or edema. Respiratory: Normal respiratory effort, no increased work of breathing. GI: Abdomen is soft, nontender, nondistended, no abdominal masses GU: Circumcised phallus with patent meatus, no palpable urethral abnormalities   Pertinent Imaging: I have personally viewed and interpreted the KUB today showing large 2.5 cm left mid ureteral stone.  At CT today shows 1 cm stone likely located within the prostatic urethra with a decompressed bladder, and 2.5 cm left mid ureteral stone with moderate hydronephrosis and some left renal atrophy  Assessment & Plan:   56 year old male with 1 week of dysuria and low back pain with CT today showing a 1 cm prostatic urethral stone as well as a large 2.5 cm left mid ureteral stone  with moderate hydronephrosis and component of left renal atrophy.  We discussed various treatment options for urolithiasis including observation with or without medical expulsive therapy, shockwave lithotripsy (SWL), ureteroscopy and laser lithotripsy with stent placement, and percutaneous nephrolithotomy.  We discussed that management is based on stone size, location, density, patient co-morbidities, and patient preference.   Stones <38mm in size have a >80% spontaneous passage rate. Data surrounding the use of tamsulosin for medical expulsive therapy is controversial, but meta analyses suggests it is most efficacious for distal stones between 5-12mm in size. Possible side effects include dizziness/lightheadedness, and retrograde ejaculation.  SWL has a lower stone free rate in a single procedure, but also a lower complication rate compared to ureteroscopy and avoids a stent and associated stent related symptoms. Possible complications include renal hematoma, steinstrasse, and need for additional treatment.  Ureteroscopy with laser lithotripsy and stent placement has a higher stone free rate than SWL in a single procedure, however increased complication rate including possible infection, ureteral injury, bleeding, and stent related morbidity. Common stent related symptoms include dysuria, urgency/frequency, and flank pain.We specifically discussed the risks ureteroscopy including bleeding, infection/sepsis, stent related symptoms including flank pain/urgency/frequency/incontinence/dysuria, ureteral injury, inability to access stone, or need for staged or additional procedures.  PCNL is the favored treatment for stones >2cm. It involves a small incision in the flank, with complete fragmentation of stones and removal. It has the highest stone free rate, but also the highest complication rate. Possible complications include bleeding, infection/sepsis, injury to surrounding organs including  the pleura, and  collecting system injury.   After an extensive discussion of the risks and benefits of the above treatment options, the patient would like to proceed this week with cystoscopy, cystolitholapaxy of prostatic urethral stone, and ureteroscopy on the left side for significant left ureteral stone burden.  He has risk of possible need for staged procedure on the left side, and possibility of inability to access stone on the left side based on the large size and chronicity based on some renal atrophy that could require additional procedures including stage ureteroscopy, nephrostomy tube, or PCNL in the future.  He is very averse to PCNL.  Norco sent to pharmacy, encouraged to use Pyridium/NSAIDs for dysuria.  Return precautions of inability to urinate or fevers discussed.   I spent 65 total minutes on the day of the encounter including pre-visit review of the medical record, face-to-face time with the patient, and post visit ordering of labs/imaging/tests.  Legrand Rams, MD 09/06/2021  [facility] [address], [address] [place], [place] [address] [phone]

## 2021-09-06 NOTE — Progress Notes (Unsigned)
Surgical Physician Order Form [facility]  * Scheduling expectation :  Friday 8/25, first case  *Length of Case: 2 hours  *Clearance needed: no  *Anticoagulation Instructions: May continue all anticoagulants  *Aspirin Instructions: Ok to continue Aspirin  *Post-op visit Date/Instructions:   TBD  *Diagnosis: Bladder Stone, left ureteral stone  *Procedure: left Ureteroscopy w/laser lithotripsy & stent placement (12811) and cystolitholapaxy <2.5cm   Additional orders: N/A  -Admit type: OUTpatient  -Anesthesia: General  -VTE Prophylaxis Standing Order SCD's       Other:   -Standing Lab Orders Per Anesthesia    Lab other: UA&Urine Culture (sent 8/22)  -Standing Test orders EKG/Chest x-ray per Anesthesia       Test other:   - Medications:  Ancef 2gm IV  -Other orders:  N/A

## 2021-09-06 NOTE — Patient Instructions (Signed)
Laser Therapy for Kidney Stones Laser therapy for kidney stones is a procedure to break up small, hard mineral deposits that form in the kidney (kidney stones). The procedure is done using a device that produces a focused beam of light (laser). The laser breaks up kidney stones into pieces that are small enough to be passed out of the body through urination or removed from the body during the procedure. You may need laser therapy if you have kidney stones that are painful or block your urinary tract. This procedure is done by inserting a tube (ureteroscope) into your kidney through the urethral opening. The urethra is the part of the body that drains urine from the bladder. In women, the urethra opens above the vaginal opening. In men, the urethra opens at the tip of the penis. The ureteroscope is inserted through the urethra, and surgical instruments are moved through the bladder and the muscular tube that connects the kidney to the bladder (ureter) until they reach the kidney. Tell a health care provider about: Any allergies you have. All medicines you are taking, including vitamins, herbs, eye drops, creams, and over-the-counter medicines. Any problems you or family members have had with anesthetic medicines. Any blood disorders you have. Any surgeries you have had. Any medical conditions you have. Whether you are pregnant or may be pregnant. What are the risks? Generally, this is a safe procedure. However, problems may occur, including: Infection. Bleeding. Allergic reactions to medicines. Damage to the urethra, bladder, or ureter. Urinary tract infection (UTI). Narrowing of the urethra (urethral stricture). Difficulty passing urine. Blockage of the kidney caused by a fragment of kidney stone. What happens before the procedure? Medicines Ask your health care provider about: Changing or stopping your regular medicines. This is especially important if you are taking diabetes medicines or  blood thinners. Taking medicines such as aspirin and ibuprofen. These medicines can thin your blood. Do not take these medicines unless your health care provider tells you to take them. Taking over-the-counter medicines, vitamins, herbs, and supplements. Eating and drinking Follow instructions from your health care provider about eating and drinking, which may include: 8 hours before the procedure - stop eating heavy meals or foods, such as meat, fried foods, or fatty foods. 6 hours before the procedure - stop eating light meals or foods, such as toast or cereal. 6 hours before the procedure - stop drinking milk or drinks that contain milk. 2 hours before the procedure - stop drinking clear liquids. Staying hydrated Follow instructions from your health care provider about hydration, which may include: Up to 2 hours before the procedure - you may continue to drink clear liquids, such as water, clear fruit juice, black coffee, and plain tea.  General instructions You may have a physical exam before the procedure. You may also have tests, such as imaging tests and blood or urine tests. If your ureter is too narrow, your health care provider may place a soft, flexible tube (stent) inside of it. The stent may be placed days or weeks before your laser therapy procedure. Plan to have someone take you home from the hospital or clinic. If you will be going home right after the procedure, plan to have someone stay with you for 24 hours. Do not use any products that contain nicotine or tobacco for at least 4 weeks before the procedure. These products include cigarettes, e-cigarettes, and chewing tobacco. If you need help quitting, ask your health care provider. Ask your health care provider: How your   surgical site will be marked or identified. What steps will be taken to help prevent infection. These may include: Removing hair at the surgery site. Washing skin with a germ-killing soap. Taking antibiotic  medicine. What happens during the procedure?  An IV will be inserted into one of your veins. You will be given one or more of the following: A medicine to help you relax (sedative). A medicine to numb the area (local anesthetic). A medicine to make you fall asleep (general anesthetic). A ureteroscope will be inserted into your urethra. The ureteroscope will send images to a video screen in the operating room to guide your surgeon to the area of your kidney that will be treated. A small, flexible tube will be threaded through the ureteroscope and into your bladder and ureter, up to your kidney. The laser device will be inserted into your kidney through the tube. Your surgeon will pulse the laser on and off to break up kidney stones. A surgical instrument that has a tiny wire basket may be inserted through the tube into your kidney to remove the pieces of broken kidney stone. The procedure may vary among health care providers and hospitals. What happens after the procedure? Your blood pressure, heart rate, breathing rate, and blood oxygen level will be monitored until you leave the hospital or clinic. You will be given pain medicine as needed. You may continue to receive antibiotics. You may have a stent temporarily placed in your ureter. Do not drive for 24 hours if you were given a sedative during your procedure. You may be given a strainer to collect any stone fragments that you pass in your urine. Your health care provider may have these tested. This information is not intended to replace advice given to you by your health care provider. Make sure you discuss any questions you have with your health care provider. Document Revised: 05/11/2021 Document Reviewed: 09/06/2020 Elsevier Patient Education  2023 [employer]  Ureteral Stent Implantation Ureteral stent implantation is a procedure to insert (implant) a flexible, soft, plastic tube (stent) into a ureter. Ureters are the tubelike  parts of the body that drain urine from the kidneys. A ureteral stent may be implanted: After a procedure to remove a blockage from the ureter (ureterolysis or pyeloplasty). To open the flow of urine when a blockage is caused by a kidney stone, tumor, blood clot, or infection. You have two ureters, one on each side of your body. The ureters connect your kidneys to your bladder. The stent is placed so that one end is in your kidney, and one end is in your bladder. The stent supports the ureter while it heals and helps to drain urine. The stent is usually taken out after your ureter has healed. Depending on your condition, you may have a stent for just a few weeks, or you may have a long-term stent that will need to be replaced every few months. Tell a health care provider about: Any allergies you have. All medicines you are taking, including vitamins, herbs, eye drops, creams, and over-the-counter medicines. Any problems you or family members have had with anesthetic medicines. Any bleeding problems you have. Any surgeries you have had. Any medical conditions you have. Whether you are pregnant or may be pregnant. What are the risks? Generally, this is a safe procedure. However, problems may occur, including: Infection. Bleeding. Allergic reactions to medicines. Damage to nearby structures or organs, such as tearing (perforation) of the ureter. Movement of the stent away from   where it is placed during surgery (migration). Buildup of a crust or hard coating (encrustation) on the stent. This happens when bacteria in the body form crystals on the stent, causing it to weaken. What happens before the procedure? Medicines Ask your health care provider about: Changing or stopping your regular medicines. These include any diabetes medicines or blood thinners you take. Taking medicines such as aspirin and ibuprofen. These medicines can thin your blood. Do not take them unless your health care provider  tells you to. Taking over-the-counter medicines, vitamins, herbs, and supplements. When to stop eating and drinking Follow instructions from your health care provider about what you may eat and drink. These may include: 8 hours before your procedure Stop eating most foods. Do not eat meat, fried foods, or fatty foods. Eat only light foods, such as toast or crackers. All liquids are okay except energy drinks and alcohol. 6 hours before your procedure Stop eating. Drink only clear liquids, such as water, clear fruit juice, black coffee, plain tea, and sports drinks. Do not drink energy drinks or alcohol. 2 hours before your procedure Stop drinking all liquids. You may be allowed to take medicines with small sips of water. If you do not follow your health care provider's instructions, your procedure may be delayed or canceled. General instructions Do not use any products that contain nicotine or tobacco for at least 4 weeks before the procedure. These products include cigarettes, chewing tobacco, and vaping devices, such as e-cigarettes. If you need help quitting, ask your health care provider. You may have an exam or testing, such as imaging or blood tests. If you will be going home right after the procedure, plan to have a responsible adult: Take you home from the hospital or clinic. You will not be allowed to drive. Care for you for the time you are told. Ask your health care provider what steps will be taken to help prevent infection. These steps may include: Removing hair at the surgery site. Washing skin with a soap that kills germs. Taking antibiotic medicine. What happens during the procedure? An IV will be inserted into one of your veins. You may be given: A medicine to help you relax (sedative). A medicine to make you fall asleep (general anesthetic). A thin, tube-shaped instrument with a light and tiny camera at the end (cystoscope) will be inserted into your urethra. The  urethra is the part of your body that drains urine from the bladder. The urethra opens at the end of the penis or in front of the vaginal opening. The cystoscope will be passed into your bladder. Guided imagery using X-ray may be used to pass a thin wire (guide wire) through your bladder and into your ureter. This wire is used to guide the stent into your ureter. The stent will be inserted into your ureter. The guide wire and the cystoscope will be removed. A thin, flexible tube (catheter) may be put through your urethra so that one end is in your bladder. This helps to drain urine from your bladder. The procedure may vary among hospitals and health care providers. What happens after the procedure? Your blood pressure, heart rate, breathing rate, and blood oxygen level will be monitored until you leave the hospital or clinic. You may continue to get medicine and fluids through an IV. You may have some soreness or pain in your abdomen and urethra. You may be given medicines for this. You will be encouraged to get up and walk around as   soon as you can. You may have a catheter draining your urine. Summary Ureteral stent implantation is a procedure to insert a flexible, soft, plastic tube (stent) into a ureter. You may have a stent implanted to support the ureter while it heals after a procedure or to open the flow of urine if there is a blockage. You may have a stent for just a few weeks, or you may have a long-term stent that will need to be replaced every few months. Follow instructions from your health care provider about taking medicines and about eating and drinking before the procedure. This information is not intended to replace advice given to you by your health care provider. Make sure you discuss any questions you have with your health care provider. Document Revised: 02/07/2021 Document Reviewed: 02/07/2021 Elsevier Patient Education  2023 [employer] Inc.   Dietary Guidelines to Help  Prevent Kidney Stones Kidney stones are deposits of minerals and salts that form inside your kidneys. Your risk of developing kidney stones may be greater depending on your diet, your lifestyle, the medicines you take, and whether you have certain medical conditions. Most people can lower their risks of developing kidney stones by following these dietary guidelines. Your dietitian may give you more specific instructions depending on your overall health and the type of kidney stones you tend to develop. What are tips for following this plan? Reading food labels  Choose foods with "no salt added" or "low-salt" labels. Limit your salt (sodium) intake to less than 1,500 mg a day. Choose foods with calcium for each meal and snack. Try to eat about 300 mg of calcium at each meal. Foods that contain 200-500 mg of calcium a serving include: 8 oz (237 mL) of milk, calcium-fortifiednon-dairy milk, and calcium-fortifiedfruit juice. Calcium-fortified means that calcium has been added to these drinks. 8 oz (237 mL) of kefir, yogurt, and soy yogurt. 4 oz (114 g) of tofu. 1 oz (28 g) of cheese. 1 cup (150 g) of dried figs. 1 cup (91 g) of cooked broccoli. One 3 oz (85 g) can of sardines or mackerel. Most people need 1,000-1,500 mg of calcium a day. Talk to your dietitian about how much calcium is recommended for you. Shopping Buy plenty of fresh fruits and vegetables. Most people do not need to avoid fruits and vegetables, even if these foods contain nutrients that may contribute to kidney stones. When shopping for convenience foods, choose: Whole pieces of fruit. Pre-made salads with dressing on the side. Low-fat fruit and yogurt smoothies. Avoid buying frozen meals or prepared deli foods. These can be high in sodium. Look for foods with live cultures, such as yogurt and kefir. Choose high-fiber grains, such as whole-wheat breads, oat bran, and wheat cereals. Cooking Do not add salt to food when cooking.  Place a salt shaker on the table and allow each person to add their own salt to taste. Use vegetable protein, such as beans, textured vegetable protein (TVP), or tofu, instead of meat in pasta, casseroles, and soups. Meal planning Eat less salt, if told by your dietitian. To do this: Avoid eating processed or pre-made food. Avoid eating fast food. Eat less animal protein, including cheese, meat, poultry, or fish, if told by your dietitian. To do this: Limit the number of times you have meat, poultry, fish, or cheese each week. Eat a diet free of meat at least 2 days a week. Eat only one serving each day of meat, poultry, fish, or seafood. When you prepare   animal proteins, cut pieces into small portion sizes. For most meat and fish, one serving is about the size of the palm of your hand. Eat at least five servings of fresh fruits and vegetables each day. To do this: Keep fruits and vegetables on hand for snacks. Eat one piece of fruit or a handful of berries with breakfast. Have a salad and fruit at lunch. Have two kinds of vegetables at dinner. You may be told to limit foods that are high in a substance called oxalate. These include: Spinach (cooked), rhubarb, beets, sweet potatoes, and Swiss chard. Peanuts. Potato chips, french fries, and baked potatoes with skin on. Nuts and nut products. Chocolate. If you regularly take a diuretic medicine, make sure to eat at least 1 or 2 servings of fruits or vegetables that are high in potassium each day. These include: Avocado. Banana. Orange, prune, carrot, or tomato juice. Baked potato. Cabbage. Beans and split peas. Lifestyle  Drink enough fluid to keep your urine pale yellow. This is the most important thing you can do. Spread your fluid intake throughout the day. If you drink alcohol: Limit how much you have to: 0-1 drink a day for women who are not pregnant. 0-2 drinks a day for men. Know how much alcohol is in your drink. In the [place]  one drink equals one 12 oz bottle of beer (355 mL), one 5 oz glass of wine (148 mL), or one 1 oz glass of hard liquor (44 mL). Lose weight if told by your health care provider. Work with your dietitian to find an eating plan and weight loss strategies that work best for you. General information Talk to your health care provider and dietitian about taking daily supplements. Depending on your health and the cause of your kidney stones, you may be told: Do not take high-dose supplements of vitamin C (1,000 mg a day or more). To take a calcium supplement. To take a daily probiotic supplement. To take other supplements such as magnesium, fish oil, or vitamin B6. Take over-the-counter and prescription medicines only as told by your health care provider. These include supplements. What foods should I limit? Limit your intake of the following foods, or eat them as told by your dietitian. Vegetables Spinach. Rhubarb. Beets. Canned vegetables. Pickles. Olives. Baked potatoes with skin. Grains Wheat bran. Baked goods. Salted crackers. Cereals high in sugar. Meats and other proteins Nuts. Nut butters. Large portions of meat, poultry, or fish. Salted, precooked, or cured meats, such as sausages, meat loaves, and hot dogs. Dairy Cheeses. Beverages Regular soft drinks. Regular vegetable juice. Seasonings and condiments Seasoning blends with salt. Salad dressings. Soy sauce. Ketchup. Barbecue sauce. Other foods Canned soups. Canned pasta sauce. Casseroles. Pizza. Lasagna. Frozen meals. Potato chips. French fries. The items listed above may not be a complete list of foods and beverages you should limit. Contact a dietitian for more information. What foods should I avoid? Talk to your dietitian about specific foods you should avoid based on the type of kidney stones you have and your overall health. Fruits Grapefruit. The item listed above may not be a complete list of foods and beverages you should  avoid. Contact a dietitian for more information. Summary Kidney stones are deposits of minerals and salts that form inside your kidneys. You can lower your risk of kidney stones by making changes to your diet. The most important thing you can do is drink enough fluid. Drink enough fluid to keep your urine pale yellow. Talk to   your dietitian about how much calcium you should have each day, and eat less salt and animal protein as told by your dietitian. This information is not intended to replace advice given to you by your health care provider. Make sure you discuss any questions you have with your health care provider. Document Revised: 04/14/2021 Document Reviewed: 04/14/2021 Elsevier Patient Education  2023 Else[employer] Inc.  

## 2021-09-06 NOTE — Progress Notes (Signed)
 09/06/21 12:08 PM   Victor Mathews 12/26/1965 [id number]  CC: Dysuria, history of nephrolithiasis  HPI: 56-year-old male who reports at least a week of burning with urination, as well as some back pain.  He has a history of nephrolithiasis previously requiring shockwave lithotripsy.  He denies any fevers or chills.  He reportedly was recommended to have left-sided percutaneous nephrolithotomy at [facility] in 2019, but he never followed up with them.  He has passed a number of small stones over the last week and he brought those to send for analysis today.  He thinks his stone type was calcium oxalate previously.  Urinalysis today relatively benign with 0-5 RBCs, 6-10 WBCs, rare bacteria, small leukocytes, nitrite negative.   Social History:  reports that he has been smoking cigarettes. He has been smoking an average of 1 pack per day. He has been exposed to tobacco smoke. He has never used smokeless tobacco. He reports current alcohol use. He reports that he does not use drugs.  Physical Exam: BP 128/86   Pulse 84   Ht 5' 10" (1.778 m)   Wt 173 lb (78.5 kg)   BMI 24.82 kg/m    Constitutional:  Alert and oriented, No acute distress. Cardiovascular: No clubbing, cyanosis, or edema. Respiratory: Normal respiratory effort, no increased work of breathing. GI: Abdomen is soft, nontender, nondistended, no abdominal masses GU: Circumcised phallus with patent meatus, no palpable urethral abnormalities   Pertinent Imaging: I have personally viewed and interpreted the KUB today showing large 2.5 cm left mid ureteral stone.  At CT today shows 1 cm stone likely located within the prostatic urethra with a decompressed bladder, and 2.5 cm left mid ureteral stone with moderate hydronephrosis and some left renal atrophy  Assessment & Plan:   56-year-old male with 1 week of dysuria and low back pain with CT today showing a 1 cm prostatic urethral stone as well as a large 2.5 cm left mid ureteral stone  with moderate hydronephrosis and component of left renal atrophy.  We discussed various treatment options for urolithiasis including observation with or without medical expulsive therapy, shockwave lithotripsy (SWL), ureteroscopy and laser lithotripsy with stent placement, and percutaneous nephrolithotomy.  We discussed that management is based on stone size, location, density, patient co-morbidities, and patient preference.   Stones <5mm in size have a >80% spontaneous passage rate. Data surrounding the use of tamsulosin for medical expulsive therapy is controversial, but meta analyses suggests it is most efficacious for distal stones between 5-10mm in size. Possible side effects include dizziness/lightheadedness, and retrograde ejaculation.  SWL has a lower stone free rate in a single procedure, but also a lower complication rate compared to ureteroscopy and avoids a stent and associated stent related symptoms. Possible complications include renal hematoma, steinstrasse, and need for additional treatment.  Ureteroscopy with laser lithotripsy and stent placement has a higher stone free rate than SWL in a single procedure, however increased complication rate including possible infection, ureteral injury, bleeding, and stent related morbidity. Common stent related symptoms include dysuria, urgency/frequency, and flank pain.We specifically discussed the risks ureteroscopy including bleeding, infection/sepsis, stent related symptoms including flank pain/urgency/frequency/incontinence/dysuria, ureteral injury, inability to access stone, or need for staged or additional procedures.  PCNL is the favored treatment for stones >2cm. It involves a small incision in the flank, with complete fragmentation of stones and removal. It has the highest stone free rate, but also the highest complication rate. Possible complications include bleeding, infection/sepsis, injury to surrounding organs including   the pleura, and  collecting system injury.   After an extensive discussion of the risks and benefits of the above treatment options, the patient would like to proceed this week with cystoscopy, cystolitholapaxy of prostatic urethral stone, and ureteroscopy on the left side for significant left ureteral stone burden.  He has risk of possible need for staged procedure on the left side, and possibility of inability to access stone on the left side based on the large size and chronicity based on some renal atrophy that could require additional procedures including stage ureteroscopy, nephrostomy tube, or PCNL in the future.  He is very averse to PCNL.  Norco sent to pharmacy, encouraged to use Pyridium/NSAIDs for dysuria.  Return precautions of inability to urinate or fevers discussed.   I spent 65 total minutes on the day of the encounter including pre-visit review of the medical record, face-to-face time with the patient, and post visit ordering of labs/imaging/tests.  Legrand Rams, MD 09/06/2021  [facility] [address], [address] [place], [place] [address] [phone]

## 2021-09-07 LAB — URINE CULTURE: Culture: NO GROWTH

## 2021-09-07 NOTE — Progress Notes (Signed)
[facility] Posting Form   Surgery Date/Time: Date: 09/09/2021  Surgeon: Dr. Legrand Rams, MD  Surgery Location: Day Surgery  Inpt ( No  )   Outpt (Yes)   Obs ( No  )   Diagnosis: N20.1 Left Ureteral Stone, N21.0 Bladder Stone  -CPT: [phone], [phone]  Surgery: Left Ureteroscopy with laser lithotripsy and stent placement, Cystolitholapaxy  Stop Anticoagulations: No, may continue ASA  Cardiac/Medical/Pulmonary Clearance needed: no  *Orders entered into EPIC  Date: 09/07/21   *Case booked in [place]  Date: 09/06/2021  *Notified pt of Surgery: Date: 09/06/2021  PRE-OP UA & CX: yes, obtained in clinic on 09/06/2021  *Placed into Prior Authorization Work Angela Nevin Date: 09/07/21   Assistant/laser/rep:No

## 2021-09-08 ENCOUNTER — Inpatient Hospital Stay: Admission: RE | Admit: 2021-09-08 | Payer: BC Managed Care – PPO | Source: Ambulatory Visit

## 2021-09-08 HISTORY — DX: Personal history of urinary calculi: Z87.442

## 2021-09-08 HISTORY — DX: Calculus in bladder: N21.0

## 2021-09-08 HISTORY — DX: Tobacco use: Z72.0

## 2021-09-08 HISTORY — DX: Occlusion and stenosis of unspecified carotid artery: I65.29

## 2021-09-08 HISTORY — DX: Other secondary gout, unspecified site: M10.40

## 2021-09-08 HISTORY — DX: Spinal stenosis, cervical region: M48.02

## 2021-09-08 HISTORY — DX: Pure hypercholesterolemia, unspecified: E78.00

## 2021-09-08 NOTE — Pre-Procedure Instructions (Signed)
Called pt this morning and again this afternoon to do his anesthesia interview for his surgery tomorrow. Both times I have left messages for him to call back and he has not. I just left pt a message telling him nothing to eat or drink after midnight tonight. NO gum, candy food or liquids after midnight. I did tell pt to either bring his medications or a list in the morning so the preop nurse could review his medications. Chart sent up to SDS without interview being done

## 2021-09-09 ENCOUNTER — Encounter
Admission: RE | Disposition: A | Discharge status: Home / Self Care | Payer: Self-pay | Source: Home / Self Care | Attending: Urology

## 2021-09-09 ENCOUNTER — Encounter: Payer: Self-pay | Admitting: Urology

## 2021-09-09 ENCOUNTER — Ambulatory Visit
Admission: RE | Admit: 2021-09-09 | Discharge: 2021-09-09 | Disposition: A | Discharge status: Home / Self Care | Payer: BC Managed Care – PPO | Attending: Urology | Admitting: Urology

## 2021-09-09 ENCOUNTER — Ambulatory Visit: Payer: BC Managed Care – PPO

## 2021-09-09 ENCOUNTER — Other Ambulatory Visit: Payer: Self-pay

## 2021-09-09 ENCOUNTER — Ambulatory Visit: Payer: BC Managed Care – PPO | Admitting: Anesthesiology

## 2021-09-09 ENCOUNTER — Other Ambulatory Visit: Payer: Self-pay | Admitting: Urology

## 2021-09-09 DIAGNOSIS — N132 Hydronephrosis with renal and ureteral calculous obstruction: Secondary | ICD-10-CM | POA: Diagnosis present

## 2021-09-09 DIAGNOSIS — N201 Calculus of ureter: Secondary | ICD-10-CM

## 2021-09-09 DIAGNOSIS — N210 Calculus in bladder: Secondary | ICD-10-CM

## 2021-09-09 DIAGNOSIS — N211 Calculus in urethra: Secondary | ICD-10-CM | POA: Insufficient documentation

## 2021-09-09 HISTORY — PX: CYSTOSCOPY WITH LITHOLAPAXY: SHX1425

## 2021-09-09 HISTORY — PX: CYSTOSCOPY/URETEROSCOPY/HOLMIUM LASER/STENT PLACEMENT: SHX6546

## 2021-09-09 SURGERY — CYSTOSCOPY/URETEROSCOPY/HOLMIUM LASER/STENT PLACEMENT
Anesthesia: General | Site: Ureter

## 2021-09-09 MED ORDER — HYDROCODONE-ACETAMINOPHEN 5-325 MG PO TABS: 1.0000 | ORAL_TABLET | Freq: Four times a day (QID) | ORAL | 0 refills | Status: AC | PRN

## 2021-09-09 MED ORDER — LIDOCAINE HCL (CARDIAC) PF 100 MG/5ML IV SOSY
PREFILLED_SYRINGE | INTRAVENOUS | Status: DC | PRN
  Administered 2021-09-09: 80 mg via INTRAVENOUS

## 2021-09-09 MED ORDER — PROPOFOL 10 MG/ML IV BOLUS
INTRAVENOUS | Status: DC | PRN
  Administered 2021-09-09: 140 mg via INTRAVENOUS

## 2021-09-09 MED ORDER — FENTANYL CITRATE (PF) 100 MCG/2ML IJ SOLN
INTRAMUSCULAR | Status: AC
  Filled 2021-09-09: qty 2

## 2021-09-09 MED ORDER — SODIUM CHLORIDE 0.9 % IR SOLN
Status: DC | PRN
  Administered 2021-09-09 (×2): 3000 mL via INTRAVESICAL

## 2021-09-09 MED ORDER — LACTATED RINGERS IV SOLN: INTRAVENOUS | Status: DC

## 2021-09-09 MED ORDER — MIDAZOLAM HCL 2 MG/2ML IJ SOLN
INTRAMUSCULAR | Status: DC | PRN
  Administered 2021-09-09 (×2): 1 mg via INTRAVENOUS

## 2021-09-09 MED ORDER — EPHEDRINE SULFATE (PRESSORS) 50 MG/ML IJ SOLN
INTRAMUSCULAR | Status: DC | PRN
  Administered 2021-09-09: 5 mg via INTRAVENOUS
  Administered 2021-09-09: 10 mg via INTRAVENOUS

## 2021-09-09 MED ORDER — FAMOTIDINE 20 MG PO TABS
ORAL_TABLET | ORAL | Status: AC
  Administered 2021-09-09: 20 mg via ORAL
  Filled 2021-09-09: qty 1

## 2021-09-09 MED ORDER — FENTANYL CITRATE (PF) 100 MCG/2ML IJ SOLN
INTRAMUSCULAR | Status: DC | PRN
  Administered 2021-09-09 (×2): 50 ug via INTRAVENOUS

## 2021-09-09 MED ORDER — FENTANYL CITRATE (PF) 100 MCG/2ML IJ SOLN: 25.0000 ug | INTRAMUSCULAR | Status: DC | PRN

## 2021-09-09 MED ORDER — SUGAMMADEX SODIUM 200 MG/2ML IV SOLN
INTRAVENOUS | Status: DC | PRN
  Administered 2021-09-09: 200 mg via INTRAVENOUS

## 2021-09-09 MED ORDER — PHENYLEPHRINE 80 MCG/ML (10ML) SYRINGE FOR IV PUSH (FOR BLOOD PRESSURE SUPPORT)
PREFILLED_SYRINGE | INTRAVENOUS | Status: DC | PRN
  Administered 2021-09-09 (×4): 80 ug via INTRAVENOUS
  Administered 2021-09-09: 160 ug via INTRAVENOUS

## 2021-09-09 MED ORDER — ROCURONIUM BROMIDE 10 MG/ML (PF) SYRINGE
PREFILLED_SYRINGE | INTRAVENOUS | Status: AC
  Filled 2021-09-09: qty 10

## 2021-09-09 MED ORDER — CHLORHEXIDINE GLUCONATE 0.12 % MT SOLN: 15.0000 mL | Freq: Once | OROMUCOSAL | Status: AC

## 2021-09-09 MED ORDER — ONDANSETRON HCL 4 MG/2ML IJ SOLN
INTRAMUSCULAR | Status: DC | PRN
  Administered 2021-09-09: 4 mg via INTRAVENOUS

## 2021-09-09 MED ORDER — LIDOCAINE HCL (PF) 2 % IJ SOLN
INTRAMUSCULAR | Status: AC
  Filled 2021-09-09: qty 5

## 2021-09-09 MED ORDER — ROCURONIUM BROMIDE 100 MG/10ML IV SOLN
INTRAVENOUS | Status: DC | PRN
  Administered 2021-09-09: 10 mg via INTRAVENOUS
  Administered 2021-09-09: 40 mg via INTRAVENOUS
  Administered 2021-09-09: 10 mg via INTRAVENOUS

## 2021-09-09 MED ORDER — GLYCOPYRROLATE 0.2 MG/ML IJ SOLN
INTRAMUSCULAR | Status: DC | PRN
  Administered 2021-09-09: .2 mg via INTRAVENOUS

## 2021-09-09 MED ORDER — IOHEXOL 180 MG/ML  SOLN
INTRAMUSCULAR | Status: DC | PRN
  Administered 2021-09-09 (×2): 10 mL

## 2021-09-09 MED ORDER — DEXAMETHASONE SODIUM PHOSPHATE 10 MG/ML IJ SOLN
INTRAMUSCULAR | Status: AC
  Filled 2021-09-09: qty 1

## 2021-09-09 MED ORDER — ACETAMINOPHEN 10 MG/ML IV SOLN
INTRAVENOUS | Status: AC
  Filled 2021-09-09: qty 100

## 2021-09-09 MED ORDER — ONDANSETRON HCL 4 MG/2ML IJ SOLN: 4.0000 mg | Freq: Once | INTRAMUSCULAR | Status: DC | PRN

## 2021-09-09 MED ORDER — ACETAMINOPHEN 10 MG/ML IV SOLN
INTRAVENOUS | Status: DC | PRN
  Administered 2021-09-09: 1000 mg via INTRAVENOUS

## 2021-09-09 MED ORDER — PHENYLEPHRINE HCL-NACL 20-0.9 MG/250ML-% IV SOLN
INTRAVENOUS | Status: AC
  Filled 2021-09-09: qty 250

## 2021-09-09 MED ORDER — DEXAMETHASONE SODIUM PHOSPHATE 10 MG/ML IJ SOLN
INTRAMUSCULAR | Status: DC | PRN
  Administered 2021-09-09: 10 mg via INTRAVENOUS

## 2021-09-09 MED ORDER — KETOROLAC TROMETHAMINE 30 MG/ML IJ SOLN
INTRAMUSCULAR | Status: DC | PRN
  Administered 2021-09-09: 30 mg via INTRAVENOUS

## 2021-09-09 MED ORDER — CHLORHEXIDINE GLUCONATE 0.12 % MT SOLN
OROMUCOSAL | Status: AC
  Administered 2021-09-09: 15 mL via OROMUCOSAL
  Filled 2021-09-09: qty 15

## 2021-09-09 MED ORDER — CEFAZOLIN SODIUM-DEXTROSE 2-4 GM/100ML-% IV SOLN
2.0000 g | INTRAVENOUS | Status: AC
  Administered 2021-09-09: 2 g via INTRAVENOUS

## 2021-09-09 MED ORDER — DEXMEDETOMIDINE (PRECEDEX) IN NS 20 MCG/5ML (4 MCG/ML) IV SYRINGE
PREFILLED_SYRINGE | INTRAVENOUS | Status: DC | PRN
  Administered 2021-09-09 (×2): 8 ug via INTRAVENOUS
  Administered 2021-09-09: 4 ug via INTRAVENOUS

## 2021-09-09 MED ORDER — TAMSULOSIN HCL 0.4 MG PO CAPS: 0.4000 mg | ORAL_CAPSULE | Freq: Every day | ORAL | 0 refills | Status: DC

## 2021-09-09 MED ORDER — MIDAZOLAM HCL 2 MG/2ML IJ SOLN
INTRAMUSCULAR | Status: AC
  Filled 2021-09-09: qty 2

## 2021-09-09 MED ORDER — PROPOFOL 10 MG/ML IV BOLUS
INTRAVENOUS | Status: AC
  Filled 2021-09-09: qty 40

## 2021-09-09 MED ORDER — ONDANSETRON HCL 4 MG/2ML IJ SOLN
INTRAMUSCULAR | Status: AC
  Filled 2021-09-09: qty 2

## 2021-09-09 MED ORDER — CEFAZOLIN SODIUM-DEXTROSE 2-4 GM/100ML-% IV SOLN
INTRAVENOUS | Status: AC
  Filled 2021-09-09: qty 100

## 2021-09-09 MED ORDER — ORAL CARE MOUTH RINSE: 15.0000 mL | Freq: Once | OROMUCOSAL | Status: AC

## 2021-09-09 MED ORDER — FAMOTIDINE 20 MG PO TABS: 20.0000 mg | ORAL_TABLET | Freq: Once | ORAL | Status: AC

## 2021-09-09 SURGICAL SUPPLY — 38 items
ADH LQ OCL WTPRF AMP STRL LF (MISCELLANEOUS)
ADHESIVE MASTISOL STRL (MISCELLANEOUS) IMPLANT
BAG DRAIN SIEMENS DORNER NS (MISCELLANEOUS) ×2 IMPLANT
BAG DRN NS LF (MISCELLANEOUS) ×2
BAG PRESSURE INF REUSE 3000 (BAG) ×2 IMPLANT
BRUSH SCRUB EZ 1% IODOPHOR (MISCELLANEOUS) ×2 IMPLANT
CATH URET FLEX-TIP 2 LUMEN 10F (CATHETERS) IMPLANT
CATH URETL OPEN 5X70 (CATHETERS) IMPLANT
CNTNR SPEC 2.5X3XGRAD LEK (MISCELLANEOUS)
CONT SPEC 4OZ STER OR WHT (MISCELLANEOUS)
CONT SPEC 4OZ STRL OR WHT (MISCELLANEOUS)
CONTAINER SPEC 2.5X3XGRAD LEK (MISCELLANEOUS) IMPLANT
DRAPE UTILITY 15X26 TOWEL STRL (DRAPES) ×2 IMPLANT
DRSG TEGADERM 2-3/8X2-3/4 SM (GAUZE/BANDAGES/DRESSINGS) IMPLANT
FIBER LASER MOSES 200 DFL (Laser) ×2 IMPLANT
FIBER LASER MOSES 365 DFL (Laser) IMPLANT
GAUZE 4X4 16PLY ~~LOC~~+RFID DBL (SPONGE) ×4 IMPLANT
GLOVE SURG UNDER POLY LF SZ7.5 (GLOVE) ×2 IMPLANT
GOWN STRL REUS W/ TWL LRG LVL3 (GOWN DISPOSABLE) ×2 IMPLANT
GOWN STRL REUS W/ TWL XL LVL3 (GOWN DISPOSABLE) ×2 IMPLANT
GOWN STRL REUS W/TWL LRG LVL3 (GOWN DISPOSABLE) ×2
GOWN STRL REUS W/TWL XL LVL3 (GOWN DISPOSABLE) ×2
GUIDEWIRE STR DUAL SENSOR (WIRE) ×2 IMPLANT
IV NS IRRIG 3000ML ARTHROMATIC (IV SOLUTION) ×2 IMPLANT
KIT PROBE TRILOGY 3.9X350 (MISCELLANEOUS) IMPLANT
KIT TURNOVER CYSTO (KITS) ×2 IMPLANT
PACK CYSTO AR (MISCELLANEOUS) ×2 IMPLANT
SET CYSTO W/LG BORE CLAMP LF (SET/KITS/TRAYS/PACK) ×2 IMPLANT
SHEATH NAVIGATOR HD 12/14X36 (SHEATH) IMPLANT
STENT URET 6FRX24 CONTOUR (STENTS) IMPLANT
STENT URET 6FRX26 CONTOUR (STENTS) IMPLANT
SURGILUBE 2OZ TUBE FLIPTOP (MISCELLANEOUS) ×2 IMPLANT
SYR 10ML LL (SYRINGE) ×2 IMPLANT
SYR TOOMEY IRRIG 70ML (MISCELLANEOUS) ×2
SYRINGE TOOMEY IRRIG 70ML (MISCELLANEOUS) ×2 IMPLANT
TRACTIP FLEXIVA PULSE ID 200 (Laser) IMPLANT
VALVE UROSEAL ADJ ENDO (VALVE) IMPLANT
WATER STERILE IRR 1000ML POUR (IV SOLUTION) ×2 IMPLANT

## 2021-09-09 NOTE — Discharge Instructions (Signed)

## 2021-09-09 NOTE — Anesthesia Procedure Notes (Signed)
Procedure Name: Intubation Date/Time: 09/09/2021 7:35 AM  Performed by: Tammi Klippel, CRNAPre-anesthesia Checklist: Patient identified, Patient being monitored, Timeout performed, Emergency Drugs available and Suction available Patient Re-evaluated:Patient Re-evaluated prior to induction Oxygen Delivery Method: Circle system utilized Preoxygenation: Pre-oxygenation with 100% oxygen Induction Type: IV induction Ventilation: Mask ventilation without difficulty and Oral airway inserted - appropriate to patient size Laryngoscope Size: McGraph and 4 Grade View: Grade I Tube type: Oral Tube size: 7.5 mm Number of attempts: 1 Airway Equipment and Method: Stylet Placement Confirmation: ETT inserted through vocal cords under direct vision, positive ETCO2 and breath sounds checked- equal and bilateral Secured at: 22 cm Tube secured with: Tape Dental Injury: Teeth and Oropharynx as per pre-operative assessment

## 2021-09-09 NOTE — Op Note (Signed)
Date of procedure: 09/09/21  Preoperative diagnosis:  Bladder stone Left ureteral stone  Postoperative diagnosis:  Same see  Procedure: Cystolitholapaxy, 1.5 cm Cystoscopy, left ureteroscopy, laser lithotripsy, left retrograde pyelogram with intraoperative interpretation, left ureteral stent placement  Surgeon: Legrand Rams, MD  Anesthesia: General  Complications: None  Intraoperative findings:  1.5cm stone at the base of the bladder, fragmented with laser and all fragments extracted Cystoscopy otherwise normal, small prostate Very large 2.5 cm tan left mid ureteral stone, approximately 50% dusted and stent placed  EBL: Minimal  Specimens: Stone for analysis  Drains: Left 6 French by 26 cm ureteral stent  Indication: Victor Mathews is a 56 y.o. patient with irritative urinary symptoms and dysuria who was found to have a 1.5 cm stone lodged in the prostatic urethra, as well as a 2.5 cm left mid ureteral stone with significant left hydronephrosis and some left renal atrophy.  After reviewing the management options for treatment, they elected to proceed with the above surgical procedure(s). We have discussed the potential benefits and risks of the procedure, side effects of the proposed treatment, the likelihood of the patient achieving the goals of the procedure, and any potential problems that might occur during the procedure or recuperation. Informed consent has been obtained.  Description of procedure:  The patient was taken to the operating room and general anesthesia was induced. SCDs were placed for DVT prophylaxis. The patient was placed in the dorsal lithotomy position, prepped and draped in the usual sterile fashion, and preoperative antibiotics(Ancef) were administered. A preoperative time-out was performed.   A 21 French rigid cystoscope was used to intubate the urethra and a normal-appearing urethra was followed proximally into the bladder.  The prostate was small.   There was a 1.5 cm yellow and black stone at the base of the bladder.  Bladder mucosa was otherwise normal throughout, and ureteral orifices orthotopic bilaterally  A 26 French resectoscope was inserted into the bladder using the visual obturator.  The 365 m laser fiber on settings of 1.5 J and 10 Hz was used to methodically fragment the 1.5 cm bladder stone into smaller pieces that were evacuated from the bladder.  A standard 21 French rigid cystoscope was then reinserted into the bladder, and a sensor wire advanced into the left ureteral orifice.  Under fluoroscopy this appeared to pass alongside the stone and into the anticipated location of the kidney.  A semirigid long ureteroscope was advanced alongside the wire and in the mid ureter a very large yellow and black stone was noted to be impacted with significant ureteral edema.  The 365 m laser fiber was advanced and on settings of 1.0 J and 10 Hz the stone was methodically fragmented.  Extreme care was taken at the edges of the stone where there was significant ureteral edema.  After I had fragmented approximately 50% of the stone I could not quite reach further up the ureter and there was fair amount of tension.  I attempted to add a second sensor wire alongside the stone but despite numerous attempts was unable to navigate this up into the kidney.  I then attempted to advance a second wire alongside the stone through the cystoscope using a 5 French access catheter, but continued to see coiling in the ureter below the stone.  At this point I opted to advance the digital flexible ureteroscope into the penis and this was followed alongside the wire and I was able to navigate this into the left ureter  and follow the ureter up to the ureteral stone.  The 365 m laser fiber on previously mentioned settings was used to further fragment the stone, and I will focused on the area where the wire was alongside the stone to confirm location in the kidney.   Ultimately I was able to break through the side of the stone and the wire did appear to be in the true lumen.  The true lumen was visualized and a wire was advanced through the digital single-channel flexible ureteroscope.  A retrograde pyelogram was performed through the scope which showed contrast passing up into the dilated collecting system.  The old original wire was removed.  There was significant ureteral edema at this point that made navigation challenging as well as some mild bleeding around the edges of the stone that inhibited vision.  At the point I opted to place a stent for some passive dilation and return in 2 weeks with plan for a ureteral access sheath and to manage the residual stone burden.  A rigid cystoscope was backloaded over the wire and a 6 [place] by 26 cm ureteral stent was placed with an excellent curl in the upper pole, as well as under direct vision the bladder.  The bladder was drained and this concluded our procedure.  Disposition: Stable to PACU  Plan: Schedule second look left ureteroscopy/laser lithotripsy/stent placement in 2 weeks Must void prior to discharge  Legrand Rams, MD

## 2021-09-09 NOTE — Transfer of Care (Signed)
Immediate Anesthesia Transfer of Care Note  Patient: Gladys Damme  Procedure(s) Performed: CYSTOSCOPY/URETEROSCOPY/RETROGRADE PYLEOGRAM/HOLMIUM LASER/STENT PLACEMENT (Left: Ureter) CYSTOSCOPY WITH LITHOLAPAXY (Bladder)  Patient Location: PACU  Anesthesia Type:General  Level of Consciousness: drowsy  Airway & Oxygen Therapy: Patient Spontanous Breathing and Patient connected to face mask oxygen  Post-op Assessment: Report given to RN and Post -op Vital signs reviewed and stable  Post vital signs: Reviewed and stable  Last Vitals:  Vitals Value Taken Time  BP 117/82 09/09/21 0858  Temp 36.7 C 09/09/21 0858  Pulse 76 09/09/21 0858  Resp 11 09/09/21 0858  SpO2 100 % 09/09/21 0858  Vitals shown include unvalidated device data.  Last Pain:  Vitals:   09/09/21 0620  TempSrc: Temporal  PainSc: 10-Worst pain ever         Complications: No notable events documented.

## 2021-09-09 NOTE — Interval H&P Note (Signed)
UROLOGY H&P UPDATE  Agree with prior H&P dated 09/06/2021.  56 year old male with lower urinary tract symptoms and CT showing a 1 cm prostatic urethra stone in addition to a large 2 cm left mid ureteral stone with likely a component of chronic obstruction and some left renal atrophy.  Urine culture negative, discussed need for possible staged procedure at length.  Cardiac: RRR Lungs: CTA bilaterally  Laterality: Left Procedure: Cystolitholapaxy, left ureteroscopy, laser lithotripsy, stent placement  Urine: Culture 8/22 no growth  We specifically discussed the risks ureteroscopy including bleeding, infection/sepsis, stent related symptoms including flank pain/urgency/frequency/incontinence/dysuria, ureteral injury, inability to access stone, or need for staged or additional procedures.  Also reviewed possible need for temporary Foley catheter placement and postoperative irritative urinary symptoms.   Sondra Come, MD 09/09/2021

## 2021-09-09 NOTE — Progress Notes (Signed)
Surgical Physician Order Form [facility] [place]  * Scheduling expectation : 2 weeks (9/8)  *Length of Case: 1.5 hours  *Clearance needed: no  *Anticoagulation Instructions: May continue all anticoagulants  *Aspirin Instructions: Ok to continue all  *Post-op visit Date/Instructions:   TBD  *Diagnosis: Left Ureteral Stone  *Procedure: left  Ureteroscopy w/laser lithotripsy & stent exchange ([id number])   Additional orders: N/A  -Admit type: OUTpatient  -Anesthesia: General  -VTE Prophylaxis Standing Order SCD's       Other:   -Standing Lab Orders Per Anesthesia    Lab other: UA&Urine Culture 1 week prior  -Standing Test orders EKG/Chest x-ray per Anesthesia       Test other:   - Medications:  Ancef 2gm IV  -Other orders:  N/A

## 2021-09-09 NOTE — Anesthesia Preprocedure Evaluation (Signed)
Anesthesia Evaluation  Patient identified by MRN, date of birth, ID band Patient awake    Reviewed: Allergy & Precautions, H&P , NPO status , Patient's Chart, lab work & pertinent test results, reviewed documented beta blocker date and time   Airway Mallampati: II  TM Distance: >3 FB Neck ROM: full    Dental  (+) Teeth Intact   Pulmonary neg pulmonary ROS, Current SmokerPatient did not abstain from smoking.,    Pulmonary exam normal        Cardiovascular Exercise Tolerance: Good negative cardio ROS Normal cardiovascular exam Rhythm:regular Rate:Normal     Neuro/Psych negative neurological ROS  negative psych ROS   GI/Hepatic negative GI ROS, Neg liver ROS,   Endo/Other  negative endocrine ROS  Renal/GU negative Renal ROS  negative genitourinary   Musculoskeletal   Abdominal   Peds  Hematology negative hematology ROS (+)   Anesthesia Other Findings Past Medical History: No date: Bladder stone No date: Carotid artery calcification No date: Cervical stenosis of spine No date: High cholesterol No date: History of kidney stones 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 No date: Tobacco use Past Surgical History: No date: TONSILLECTOMY BMI    Body Mass Index: 24.83 kg/m     Reproductive/Obstetrics negative OB ROS                             Anesthesia Physical Anesthesia Plan  ASA: 2  Anesthesia Plan: General ETT   Post-op Pain Management:    Induction:   PONV Risk Score and Plan: 2  Airway Management Planned:   Additional Equipment:   Intra-op Plan:   Post-operative Plan:   Informed Consent: I have reviewed the patients History and Physical, chart, labs and discussed the procedure including the risks, benefits and alternatives for the proposed anesthesia with the patient or authorized representative who has indicated his/her understanding and acceptance.     Dental Advisory  Given  Plan Discussed with: CRNA  Anesthesia Plan Comments:         Anesthesia Quick Evaluation

## 2021-09-10 NOTE — Anesthesia Postprocedure Evaluation (Signed)
Anesthesia Post Note  Patient: Victor Mathews  Procedure(s) Performed: CYSTOSCOPY/URETEROSCOPY/RETROGRADE PYLEOGRAM/HOLMIUM LASER/STENT PLACEMENT (Left: Ureter) CYSTOSCOPY WITH LITHOLAPAXY (Bladder)  Patient location during evaluation: PACU Anesthesia Type: General Level of consciousness: awake and alert Pain management: pain level controlled Vital Signs Assessment: post-procedure vital signs reviewed and stable Respiratory status: spontaneous breathing, nonlabored ventilation, respiratory function stable and patient connected to nasal cannula oxygen Cardiovascular status: blood pressure returned to baseline and stable Postop Assessment: no apparent nausea or vomiting Anesthetic complications: no   No notable events documented.   Last Vitals:  Vitals:   09/09/21 0930 09/09/21 0942  BP: (!) 123/90   Pulse: 75 68  Resp: 14 16  Temp: 36.6 C 36.4 C  SpO2: 99% 98%    Last Pain:  Vitals:   09/09/21 0942  TempSrc: Temporal  PainSc: 8                  Yevette Edwards

## 2021-09-11 ENCOUNTER — Encounter: Payer: Self-pay | Admitting: Urology

## 2021-09-12 LAB — CALCULI, WITH PHOTOGRAPH (CLINICAL LAB)
Calcium Oxalate Dihydrate: 20 %
Calcium Oxalate Monohydrate: 80 %
Weight Calculi: 197 mg

## 2021-09-14 ENCOUNTER — Other Ambulatory Visit: Payer: Self-pay | Admitting: Urology

## 2021-09-14 ENCOUNTER — Telehealth: Payer: Self-pay

## 2021-09-14 MED ORDER — OXYBUTYNIN CHLORIDE ER 10 MG PO TB24: 10.0000 mg | ORAL_TABLET | Freq: Every day | ORAL | 0 refills | Status: DC

## 2021-09-14 MED ORDER — HYDROCODONE-ACETAMINOPHEN 5-325 MG PO TABS: 1.0000 | ORAL_TABLET | Freq: Four times a day (QID) | ORAL | 0 refills | Status: DC | PRN

## 2021-09-14 MED ORDER — TAMSULOSIN HCL 0.4 MG PO CAPS: 0.4000 mg | ORAL_CAPSULE | Freq: Every day | ORAL | 0 refills | Status: DC

## 2021-09-14 NOTE — Telephone Encounter (Signed)
Spoke with pt. Pt. Advised. I have sent in medication to pharmacy of patients choice.

## 2021-09-14 NOTE — Telephone Encounter (Signed)
Called patient to set up surgery. Patient states that he is in excruciating pain again today and feels like he is passing parts of his stone. Reports that he feels like he is peeing razor blades. Patient states that he is not sure if he can make it until 09/08 for his surgery, or he may need additional pain meds to get him to this date. Please Advise.

## 2021-09-15 NOTE — Progress Notes (Signed)
[facility] Posting Form   Surgery Date/Time: Date: 09/23/2021  Surgeon: Dr. Legrand Rams, MD  Surgery Location: Day Surgery  Inpt ( No  )   Outpt (Yes)   Obs ( No  )   Diagnosis: N20.1 Left Ureteral Stone  -CPT: [phone]  Surgery: Left Ureteroscopy with laser lithotripsy and stent exchange  Stop Anticoagulations: No, may continue all  Cardiac/Medical/Pulmonary Clearance needed: no  *Orders entered into EPIC  Date: 09/15/21   *Case booked in [place]  Date: 09/14/2021  *Notified pt of Surgery: Date: 09/14/2021  PRE-OP UA & CX: yes, will obtain at [facility] lab today 09/15/2021  *Placed into Prior Authorization Work Angela Nevin Date: 09/15/21   Assistant/laser/rep:No

## 2021-09-15 NOTE — Addendum Note (Signed)
Addended by: Letta Kocher A on: 09/15/2021 11:09 AM   Modules accepted: Orders

## 2021-09-16 ENCOUNTER — Telehealth: Payer: Self-pay

## 2021-09-16 ENCOUNTER — Other Ambulatory Visit: Payer: Self-pay

## 2021-09-16 ENCOUNTER — Other Ambulatory Visit
Admission: RE | Admit: 2021-09-16 | Discharge: 2021-09-16 | Disposition: A | Discharge status: Home / Self Care | Payer: BC Managed Care – PPO | Attending: Urology | Admitting: Urology

## 2021-09-16 DIAGNOSIS — N201 Calculus of ureter: Secondary | ICD-10-CM

## 2021-09-16 LAB — URINALYSIS, COMPLETE (UACMP) WITH MICROSCOPIC
Bilirubin Urine: NEGATIVE
Glucose, UA: NEGATIVE mg/dL
Ketones, ur: NEGATIVE mg/dL
Nitrite: NEGATIVE
Protein, ur: 100 mg/dL — AB
RBC / HPF: >50 RBC/hpf (ref 0–5)
Specific Gravity, Urine: 1.02 (ref 1.005–1.030)
pH: 5.5 (ref 5.0–8.0)

## 2021-09-16 LAB — CALCULI, WITH PHOTOGRAPH (CLINICAL LAB)
Calcium Oxalate Monohydrate: 100 %
Weight Calculi: 226 mg

## 2021-09-16 MED ORDER — CEPHALEXIN 500 MG PO CAPS: 500.0000 mg | ORAL_CAPSULE | Freq: Two times a day (BID) | ORAL | 0 refills | Status: DC

## 2021-09-16 NOTE — Telephone Encounter (Signed)
Spoke with pt. Pt. Advised. Verbalized understanding and need to start medication. I have sent in medication to patient pharmacy per his request.

## 2021-09-16 NOTE — Telephone Encounter (Signed)
-----   Message from Sondra Come, MD sent at 09/16/2021  2:32 PM EDT ----- Please start Keflex 500 mg twice daily x7 days for suspicious urinalysis, will call when culture resulted if need to change  Legrand Rams, MD 09/16/2021

## 2021-09-18 LAB — URINE CULTURE: Culture: NO GROWTH

## 2021-09-21 ENCOUNTER — Inpatient Hospital Stay
Admission: RE | Admit: 2021-09-21 | Discharge: 2021-09-21 | Disposition: A | Discharge status: Home / Self Care | Payer: BC Managed Care – PPO | Source: Ambulatory Visit

## 2021-09-22 ENCOUNTER — Encounter
Admission: RE | Admit: 2021-09-22 | Discharge: 2021-09-22 | Disposition: A | Discharge status: Home / Self Care | Payer: BC Managed Care – PPO | Source: Ambulatory Visit | Attending: Urology | Admitting: Urology

## 2021-09-22 NOTE — Patient Instructions (Signed)
Your procedure is scheduled on:09-23-21 Friday Report to the Registration Desk on the 1st floor of the [facility].Then proceed to the 2nd floor Surgery Desk To find out your arrival time, please call [phone] between 1PM - 3PM on:09-22-21 Thursday If your arrival time is 6:00 am, do not arrive prior to that time as the [facility] entrance doors do not open until 6:00 am.  REMEMBER: Instructions that are not followed completely may result in serious medical risk, up to and including death; or upon the discretion of your surgeon and anesthesiologist your surgery may need to be rescheduled.  Do not eat food OR drink any liquids after midnight the night before surgery.  No gum chewing, lozengers or hard candies.  Do NOT take any medication the day of surgery  One week prior to surgery: Stop Anti-inflammatories (NSAIDS) such as Advil, Aleve, Ibuprofen, Motrin, Naproxen, Naprosyn and Aspirin based products such as Excedrin, Goodys Powder, BC Powder. You may however, take Tylenol if needed for pain up until the day of surgery.  No Alcohol for 24 hours before or after surgery.  No Smoking including e-cigarettes for 24 hours prior to surgery.  No chewable tobacco products for at least 6 hours prior to surgery.  No nicotine patches on the day of surgery.  Do not use any "recreational" drugs for at least a week prior to your surgery.  Please be advised that the combination of cocaine and anesthesia may have negative outcomes, up to and including death. If you test positive for cocaine, your surgery will be cancelled.  On the morning of surgery brush your teeth with toothpaste and water, you may rinse your mouth with mouthwash if you wish. Do not swallow any toothpaste or mouthwash.  Do not wear jewelry, make-up, hairpins, clips or nail polish.  Do not wear lotions, powders, or perfumes.   Do not shave body from the neck down 48 hours prior to surgery just in case you cut yourself which  could leave a site for infection.  Also, freshly shaved skin may become irritated if using the CHG soap.  Contact lenses, hearing aids and dentures may not be worn into surgery.  Do not bring valuables to the hospital. [facility] is not responsible for any missing/lost belongings or valuables.   Notify your doctor if there is any change in your medical condition (cold, fever, infection).  Wear comfortable clothing (specific to your surgery type) to the hospital.  After surgery, you can help prevent lung complications by doing breathing exercises.  Take deep breaths and cough every 1-2 hours. Your doctor may order a device called an Incentive Spirometer to help you take deep breaths. When coughing or sneezing, hold a pillow firmly against your incision with both hands. This is called "splinting." Doing this helps protect your incision. It also decreases belly discomfort.  If you are being admitted to the hospital overnight, leave your suitcase in the car. After surgery it may be brought to your room.  If you are being discharged the day of surgery, you will not be allowed to drive home. You will need a responsible adult (18 years or older) to drive you home and stay with you that night.   If you are taking public transportation, you will need to have a responsible adult (18 years or older) with you. Please confirm with your physician that it is acceptable to use public transportation.   Please call the Pre-admissions Testing Dept. at [phone] if you have  any questions about these instructions.  Surgery Visitation Policy:  Patients undergoing a surgery or procedure may have two family members or support persons with them as long as the person is not COVID-19 positive or experiencing its symptoms.

## 2021-09-23 ENCOUNTER — Ambulatory Visit
Admission: RE | Admit: 2021-09-23 | Discharge: 2021-09-23 | Disposition: A | Discharge status: Home / Self Care | Payer: BC Managed Care – PPO | Attending: Urology | Admitting: Urology

## 2021-09-23 ENCOUNTER — Ambulatory Visit: Payer: BC Managed Care – PPO | Admitting: Urgent Care

## 2021-09-23 ENCOUNTER — Ambulatory Visit: Payer: BC Managed Care – PPO

## 2021-09-23 ENCOUNTER — Encounter
Admission: RE | Disposition: A | Discharge status: Home / Self Care | Payer: Self-pay | Source: Home / Self Care | Attending: Urology

## 2021-09-23 ENCOUNTER — Other Ambulatory Visit: Payer: Self-pay

## 2021-09-23 ENCOUNTER — Encounter: Payer: Self-pay | Admitting: Urology

## 2021-09-23 DIAGNOSIS — E7800 Pure hypercholesterolemia, unspecified: Secondary | ICD-10-CM | POA: Diagnosis not present

## 2021-09-23 DIAGNOSIS — Z87442 Personal history of urinary calculi: Secondary | ICD-10-CM | POA: Diagnosis not present

## 2021-09-23 DIAGNOSIS — N202 Calculus of kidney with calculus of ureter: Secondary | ICD-10-CM

## 2021-09-23 DIAGNOSIS — N201 Calculus of ureter: Secondary | ICD-10-CM

## 2021-09-23 DIAGNOSIS — F17210 Nicotine dependence, cigarettes, uncomplicated: Secondary | ICD-10-CM | POA: Insufficient documentation

## 2021-09-23 DIAGNOSIS — N132 Hydronephrosis with renal and ureteral calculous obstruction: Secondary | ICD-10-CM | POA: Insufficient documentation

## 2021-09-23 HISTORY — PX: CYSTOSCOPY/URETEROSCOPY/HOLMIUM LASER/STENT PLACEMENT: SHX6546

## 2021-09-23 HISTORY — PX: CYSTOSCOPY W/ RETROGRADES: SHX1426

## 2021-09-23 SURGERY — CYSTOSCOPY/URETEROSCOPY/HOLMIUM LASER/STENT PLACEMENT
Anesthesia: General | Laterality: Left

## 2021-09-23 MED ORDER — DEXMEDETOMIDINE (PRECEDEX) IN NS 20 MCG/5ML (4 MCG/ML) IV SYRINGE
PREFILLED_SYRINGE | INTRAVENOUS | Status: DC | PRN
  Administered 2021-09-23 (×2): 8 ug via INTRAVENOUS

## 2021-09-23 MED ORDER — ONDANSETRON HCL 4 MG/2ML IJ SOLN
INTRAMUSCULAR | Status: AC
  Filled 2021-09-23: qty 2

## 2021-09-23 MED ORDER — PROPOFOL 10 MG/ML IV BOLUS
INTRAVENOUS | Status: AC
  Filled 2021-09-23: qty 20

## 2021-09-23 MED ORDER — ORAL CARE MOUTH RINSE: 15.0000 mL | Freq: Once | OROMUCOSAL | Status: AC

## 2021-09-23 MED ORDER — FAMOTIDINE 20 MG PO TABS: 20.0000 mg | ORAL_TABLET | Freq: Once | ORAL | Status: AC

## 2021-09-23 MED ORDER — LIDOCAINE HCL (CARDIAC) PF 100 MG/5ML IV SOSY
PREFILLED_SYRINGE | INTRAVENOUS | Status: DC | PRN
  Administered 2021-09-23: 100 mg via INTRAVENOUS

## 2021-09-23 MED ORDER — PHENYLEPHRINE 80 MCG/ML (10ML) SYRINGE FOR IV PUSH (FOR BLOOD PRESSURE SUPPORT)
PREFILLED_SYRINGE | INTRAVENOUS | Status: DC | PRN
  Administered 2021-09-23 (×2): 80 ug via INTRAVENOUS
  Administered 2021-09-23: 160 ug via INTRAVENOUS
  Administered 2021-09-23: 80 ug via INTRAVENOUS

## 2021-09-23 MED ORDER — SODIUM CHLORIDE 0.9 % IR SOLN
Status: DC | PRN
  Administered 2021-09-23: 5000 mL via INTRAVESICAL

## 2021-09-23 MED ORDER — FENTANYL CITRATE (PF) 100 MCG/2ML IJ SOLN
INTRAMUSCULAR | Status: DC | PRN
Start: 2021-09-23 — End: 2021-09-23
  Administered 2021-09-23 (×2): 25 ug via INTRAVENOUS
  Administered 2021-09-23 (×3): 50 ug via INTRAVENOUS

## 2021-09-23 MED ORDER — DROPERIDOL 2.5 MG/ML IJ SOLN: 0.6250 mg | Freq: Once | INTRAMUSCULAR | Status: DC | PRN

## 2021-09-23 MED ORDER — CHLORHEXIDINE GLUCONATE 0.12 % MT SOLN: 15.0000 mL | Freq: Once | OROMUCOSAL | Status: AC

## 2021-09-23 MED ORDER — CEPHALEXIN 500 MG PO CAPS: 500.0000 mg | ORAL_CAPSULE | Freq: Every day | ORAL | 0 refills | Status: DC

## 2021-09-23 MED ORDER — KETOROLAC TROMETHAMINE 30 MG/ML IJ SOLN
INTRAMUSCULAR | Status: DC | PRN
  Administered 2021-09-23: 30 mg via INTRAVENOUS

## 2021-09-23 MED ORDER — LIDOCAINE HCL (PF) 2 % IJ SOLN
INTRAMUSCULAR | Status: AC
  Filled 2021-09-23: qty 5

## 2021-09-23 MED ORDER — DEXAMETHASONE SODIUM PHOSPHATE 10 MG/ML IJ SOLN
INTRAMUSCULAR | Status: AC
  Filled 2021-09-23: qty 1

## 2021-09-23 MED ORDER — ACETAMINOPHEN 10 MG/ML IV SOLN: 1000.0000 mg | Freq: Once | INTRAVENOUS | Status: DC | PRN

## 2021-09-23 MED ORDER — FENTANYL CITRATE (PF) 100 MCG/2ML IJ SOLN
INTRAMUSCULAR | Status: AC
  Filled 2021-09-23: qty 2

## 2021-09-23 MED ORDER — DEXAMETHASONE SODIUM PHOSPHATE 10 MG/ML IJ SOLN
INTRAMUSCULAR | Status: DC | PRN
  Administered 2021-09-23: 10 mg via INTRAVENOUS

## 2021-09-23 MED ORDER — MIDAZOLAM HCL 2 MG/2ML IJ SOLN
INTRAMUSCULAR | Status: AC
  Filled 2021-09-23: qty 2

## 2021-09-23 MED ORDER — LACTATED RINGERS IV SOLN: INTRAVENOUS | Status: DC

## 2021-09-23 MED ORDER — SUGAMMADEX SODIUM 200 MG/2ML IV SOLN
INTRAVENOUS | Status: DC | PRN
  Administered 2021-09-23: 200 mg via INTRAVENOUS

## 2021-09-23 MED ORDER — ACETAMINOPHEN 10 MG/ML IV SOLN
INTRAVENOUS | Status: DC | PRN
  Administered 2021-09-23: 1000 mg via INTRAVENOUS

## 2021-09-23 MED ORDER — TAMSULOSIN HCL 0.4 MG PO CAPS: 0.4000 mg | ORAL_CAPSULE | Freq: Every day | ORAL | 1 refills | Status: AC

## 2021-09-23 MED ORDER — FENTANYL CITRATE (PF) 100 MCG/2ML IJ SOLN: 25.0000 ug | INTRAMUSCULAR | Status: DC | PRN

## 2021-09-23 MED ORDER — ONDANSETRON HCL 4 MG/2ML IJ SOLN
INTRAMUSCULAR | Status: DC | PRN
  Administered 2021-09-23: 4 mg via INTRAVENOUS

## 2021-09-23 MED ORDER — OXYCODONE-ACETAMINOPHEN 5-325 MG PO TABS: 1.0000 | ORAL_TABLET | Freq: Four times a day (QID) | ORAL | 0 refills | Status: AC | PRN

## 2021-09-23 MED ORDER — CEFAZOLIN SODIUM-DEXTROSE 2-4 GM/100ML-% IV SOLN
INTRAVENOUS | Status: AC
  Filled 2021-09-23: qty 100

## 2021-09-23 MED ORDER — CHLORHEXIDINE GLUCONATE 0.12 % MT SOLN
OROMUCOSAL | Status: AC
  Administered 2021-09-23: 15 mL via OROMUCOSAL
  Filled 2021-09-23: qty 15

## 2021-09-23 MED ORDER — ROCURONIUM BROMIDE 10 MG/ML (PF) SYRINGE
PREFILLED_SYRINGE | INTRAVENOUS | Status: AC
  Filled 2021-09-23: qty 10

## 2021-09-23 MED ORDER — ACETAMINOPHEN 10 MG/ML IV SOLN
INTRAVENOUS | Status: AC
  Filled 2021-09-23: qty 100

## 2021-09-23 MED ORDER — FAMOTIDINE 20 MG PO TABS
ORAL_TABLET | ORAL | Status: AC
  Administered 2021-09-23: 20 mg via ORAL
  Filled 2021-09-23: qty 1

## 2021-09-23 MED ORDER — OXYCODONE HCL 5 MG PO TABS: 5.0000 mg | ORAL_TABLET | Freq: Once | ORAL | Status: DC | PRN

## 2021-09-23 MED ORDER — IOHEXOL 180 MG/ML  SOLN
INTRAMUSCULAR | Status: DC | PRN
  Administered 2021-09-23: 10 mL

## 2021-09-23 MED ORDER — ROCURONIUM BROMIDE 100 MG/10ML IV SOLN
INTRAVENOUS | Status: DC | PRN
  Administered 2021-09-23: 50 mg via INTRAVENOUS
  Administered 2021-09-23 (×3): 10 mg via INTRAVENOUS

## 2021-09-23 MED ORDER — OXYCODONE HCL 5 MG/5ML PO SOLN: 5.0000 mg | Freq: Once | ORAL | Status: DC | PRN

## 2021-09-23 MED ORDER — MIDAZOLAM HCL 2 MG/2ML IJ SOLN
INTRAMUSCULAR | Status: DC | PRN
  Administered 2021-09-23: 2 mg via INTRAVENOUS

## 2021-09-23 MED ORDER — OXYBUTYNIN CHLORIDE ER 10 MG PO TB24: 10.0000 mg | ORAL_TABLET | Freq: Every evening | ORAL | 1 refills | Status: AC

## 2021-09-23 MED ORDER — CEFAZOLIN SODIUM-DEXTROSE 2-4 GM/100ML-% IV SOLN
2.0000 g | INTRAVENOUS | Status: AC
  Administered 2021-09-23: 2 g via INTRAVENOUS

## 2021-09-23 MED ORDER — PROPOFOL 10 MG/ML IV BOLUS
INTRAVENOUS | Status: DC | PRN
  Administered 2021-09-23: 150 mg via INTRAVENOUS

## 2021-09-23 MED ORDER — PROMETHAZINE HCL 25 MG/ML IJ SOLN: 6.2500 mg | INTRAMUSCULAR | Status: DC | PRN

## 2021-09-23 SURGICAL SUPPLY — 35 items
ADH LQ OCL WTPRF AMP STRL LF (MISCELLANEOUS)
ADHESIVE MASTISOL STRL (MISCELLANEOUS) IMPLANT
BAG DRAIN SIEMENS DORNER NS (MISCELLANEOUS) ×2 IMPLANT
BAG DRN NS LF (MISCELLANEOUS) ×2
BAG PRESSURE INF REUSE 3000 (BAG) ×2 IMPLANT
BRUSH SCRUB EZ 1% IODOPHOR (MISCELLANEOUS) ×2 IMPLANT
CATH URET FLEX-TIP 2 LUMEN 10F (CATHETERS) IMPLANT
CATH URETL OPEN 5X70 (CATHETERS) IMPLANT
CNTNR SPEC 2.5X3XGRAD LEK (MISCELLANEOUS)
CONT SPEC 4OZ STER OR WHT (MISCELLANEOUS)
CONT SPEC 4OZ STRL OR WHT (MISCELLANEOUS)
CONTAINER SPEC 2.5X3XGRAD LEK (MISCELLANEOUS) IMPLANT
DRAPE UTILITY 15X26 TOWEL STRL (DRAPES) ×2 IMPLANT
DRSG TEGADERM 2-3/8X2-3/4 SM (GAUZE/BANDAGES/DRESSINGS) IMPLANT
FIBER LASER MOSES 365 DFL (Laser) IMPLANT
GLOVE SURG UNDER POLY LF SZ7.5 (GLOVE) ×2 IMPLANT
GOWN STRL REUS W/ TWL LRG LVL3 (GOWN DISPOSABLE) ×2 IMPLANT
GOWN STRL REUS W/ TWL XL LVL3 (GOWN DISPOSABLE) ×2 IMPLANT
GOWN STRL REUS W/TWL LRG LVL3 (GOWN DISPOSABLE) ×2
GOWN STRL REUS W/TWL XL LVL3 (GOWN DISPOSABLE) ×2
GUIDEWIRE STR DUAL SENSOR (WIRE) ×2 IMPLANT
IV NS IRRIG 3000ML ARTHROMATIC (IV SOLUTION) ×2 IMPLANT
KIT TURNOVER CYSTO (KITS) ×2 IMPLANT
PACK CYSTO AR (MISCELLANEOUS) ×2 IMPLANT
SET CYSTO W/LG BORE CLAMP LF (SET/KITS/TRAYS/PACK) ×2 IMPLANT
SHEATH NAVIGATOR HD 12/14X36 (SHEATH) IMPLANT
SHEATH NAVIGATOR HD 12/14X46 (SHEATH) IMPLANT
STENT URET 6FRX24 CONTOUR (STENTS) IMPLANT
STENT URET 6FRX26 CONTOUR (STENTS) IMPLANT
SURGILUBE 2OZ TUBE FLIPTOP (MISCELLANEOUS) ×2 IMPLANT
SYR 10ML LL (SYRINGE) ×2 IMPLANT
TRACTIP FLEXIVA PULSE ID 200 (Laser) IMPLANT
TRAP FLUID SMOKE EVACUATOR (MISCELLANEOUS) ×2 IMPLANT
VALVE UROSEAL ADJ ENDO (VALVE) IMPLANT
WATER STERILE IRR 500ML POUR (IV SOLUTION) ×2 IMPLANT

## 2021-09-23 NOTE — Op Note (Signed)
Date of procedure: 09/23/21  Preoperative diagnosis:  Left ureteral stone Left renal stones  Postoperative diagnosis:  Same  Procedure: Left ureteroscopy, laser lithotripsy of left ureteral stone(22 modifier) Left ureteroscopy, laser lithotripsy of left renal stones Cystoscopy, left retrograde pyelogram with intraoperative interpretation, left ureteral stent placement   Surgeon: Legrand Rams, MD  Anesthesia: General  Complications: None  Intraoperative findings:  Normal cystoscopy, no bladder stones or lesions Significant left mid ureteral remaining stone burden including some impacted ureteral stone fragments and the ureteral mucosa, all fragmented and irrigated free Multiple left renal stones all dusted Ureter appeared patent at the conclusion of the procedure with no residual stones larger than the size of the laser fiber Hydronephrosis on retrograde pyelogram, no extravasation, uncomplicated stent placement  EBL: Minimal  Specimens: None  Drains: Left 6 French by 26 cm ureteral stent  Indication: Victor Mathews is a 56 y.o. patient who previously presented with dysuria and was found to have a 1.5 cm prostatic urethra stone in addition to a 2.5 cm left mid ureteral stone with hydronephrosis and component of left renal atrophy.  Previously underwent cystolitholapaxy of the bladder stone, and approximately 50% of the left mid ureteral stone.  Presents today for staged second look left ureteroscopy for management of residual left-sided stone burden.    After reviewing the management options for treatment, they elected to proceed with the above surgical procedure(s). We have discussed the potential benefits and risks of the procedure, side effects of the proposed treatment, the likelihood of the patient achieving the goals of the procedure, and any potential problems that might occur during the procedure or recuperation. Informed consent has been obtained.  Description of  procedure:  The patient was taken to the operating room and general anesthesia was induced. SCDs were placed for DVT prophylaxis. The patient was placed in the dorsal lithotomy position, prepped and draped in the usual sterile fashion, and preoperative antibiotics(Ancef) were administered. A preoperative time-out was performed.   A 21 French rigid cystoscope was used to intubate the urethra and thorough cystoscopy was performed.  The bladder was grossly normal with no suspicious lesions or stone fragments.  A sensor wire was advanced through the left ureteral orifice alongside the stent and advanced up to the kidney under fluoroscopic vision.  The old stent was removed.  A semirigid long ureteroscope was advanced alongside the wire and I was able to advance this to the mid ureter where there was at least 1.5 cm of remaining residual stone burden.  A 365 m laser fiber on settings of 1.0 J and 10 Hz was used to methodically fragment the stone, taking extreme care around the edges where stone appeared to be impacted within the ureteral mucosa.  This took approximately 45 minutes.  At no point did appear that the ureteral mucosa was violated, however there were some small stone fragments that appeared to be embedded within the mucosa.  There appeared to be some further stone in the proximal ureter, but could not reach this with the semirigid ureteroscope.  Mild pole small stone fragments were irrigated free from the ureter using the ureteroscope.  The ureteroscope was then readvanced into the ureter and a second wire was added up into the kidney and the ureteroscope removed.  A 12/14 French ureteral access sheath was advanced over the wire under fluoroscopic vision up to the proximal ureter where residual stone fragments could be seen in the proximal ureter.  The digital single-channel flexible ureteroscope was advanced  through the sheath and under direct and additional 8 to 10 mm of stone fragments were noted.   The laser fiber on previously mentioned settings was used to methodically fragment the stones, and they were irrigated free through the sheath.  At this point thorough ureteroscopy of the proximal ureter showed no additional stone fragments.  The digital ureteroscope was then advanced all the way up into the kidney and thorough pyeloscopy performed.  There were multiple stone fragments within the mid and lower poles, and the 365 m laser fiber on settings of 0.5 J and 80 Hz was used to methodically dust these fragments.  This took at least an additional 30 minutes of dusting.  Thorough pyeloscopy revealed no fragments larger than the size of the laser fiber at the conclusion of dusting.  The sheath was then carefully backed out and the flexible ureteroscope used to fragment a few small remaining stone fragments that remained in the ureter on settings of 1.0 J and 10 Hz.  There was no evidence of ureteral injury.  I then readvanced the semirigid ureteroscope alongside the wire and no additional stone fragments were visualized.  A retrograde pyelogram was performed from the mid ureter which showed no extravasation or filling defects, and a dilated hydronephrotic system.  The rigid cystoscope was backloaded over the wire and a 6 [place] by 26 cm ureteral stent was uneventfully placed with an excellent curl in the upper pole, as well as in the bladder.  Fluid drained through the sideport of the stent.  I then irrigated free all small stone fragments from the bladder, and there was no bleeding noted or residual stone fragments.  The bladder was drained and this concluded our procedure.   22 modifier-extremely challenging case that took approximately 60 minutes more than typical ureteroscopy secondary to significant ureteral edema with large and impacted ureteral stone.  Disposition: Stable to PACU  Plan: Follow-up for stent removal in clinic in 2 weeks Will need renal ultrasound in 2 months to evaluate  for silent hydronephrosis, anticipate this may be chronic in will need to be monitored long-term  Legrand Rams, MD

## 2021-09-23 NOTE — Anesthesia Procedure Notes (Signed)
Procedure Name: Intubation Date/Time: 09/23/2021 8:44 AM  Performed by: Tammi Klippel, CRNAPre-anesthesia Checklist: Patient identified, Patient being monitored, Timeout performed, Emergency Drugs available and Suction available Patient Re-evaluated:Patient Re-evaluated prior to induction Oxygen Delivery Method: Circle system utilized Preoxygenation: Pre-oxygenation with 100% oxygen Induction Type: IV induction Ventilation: Mask ventilation without difficulty and Oral airway inserted - appropriate to patient size Laryngoscope Size: McGraph and 4 Grade View: Grade I Tube type: Oral Tube size: 7.0 mm Number of attempts: 1 Airway Equipment and Method: Stylet Placement Confirmation: ETT inserted through vocal cords under direct vision, positive ETCO2 and breath sounds checked- equal and bilateral Secured at: 21 cm Tube secured with: Tape Dental Injury: Teeth and Oropharynx as per pre-operative assessment

## 2021-09-23 NOTE — Transfer of Care (Signed)
Immediate Anesthesia Transfer of Care Note  Patient: Victor Mathews  Procedure(s) Performed: CYSTOSCOPY/URETEROSCOPY/HOLMIUM LASER/STENT EXCHANGE (Left) CYSTOSCOPY WITH RETROGRADE PYELOGRAM  Patient Location: PACU  Anesthesia Type:General  Level of Consciousness: awake  Airway & Oxygen Therapy: Patient Spontanous Breathing and Patient connected to face mask oxygen  Post-op Assessment: Report given to RN and Post -op Vital signs reviewed and stable  Post vital signs: Reviewed and stable  Last Vitals:  Vitals Value Taken Time  BP 120/77 09/23/21 1026  Temp    Pulse 51 09/23/21 1026  Resp 12 09/23/21 1026  SpO2 100 % 09/23/21 1026    Last Pain:  Vitals:   09/23/21 0754  TempSrc: Temporal  PainSc: 8          Complications: No notable events documented.

## 2021-09-23 NOTE — Discharge Instructions (Signed)
AMBULATORY SURGERY  ?DISCHARGE INSTRUCTIONS ? ? ?The drugs that you were given will stay in your system until tomorrow so for the next 24 hours you should not: ? ?Drive an automobile ?Make any legal decisions ?Drink any alcoholic beverage ? ? ?You may resume regular meals tomorrow.  Today it is better to start with liquids and gradually work up to solid foods. ? ?You may eat anything you prefer, but it is better to start with liquids, then soup and crackers, and gradually work up to solid foods. ? ? ?Please notify your doctor immediately if you have any unusual bleeding, trouble breathing, redness and pain at the surgery site, drainage, fever, or pain not relieved by medication. ? ? ? ?Additional Instructions: ? ? ? ?Please contact your physician with any problems or Same Day Surgery at [phone], Monday through Friday 6 am to 4 pm, or [place] [facility] [place] Main number at [phone].  ?

## 2021-09-23 NOTE — Anesthesia Preprocedure Evaluation (Addendum)
Anesthesia Evaluation  Patient identified by MRN, date of birth, ID band Patient awake    Reviewed: Allergy & Precautions, H&P , NPO status , Patient's Chart, lab work & pertinent test results, reviewed documented beta blocker date and time   Airway Mallampati: III  TM Distance: >3 FB Neck ROM: full    Dental  (+) Teeth Intact   Pulmonary Current Smoker and Patient abstained from smoking.,    Pulmonary exam normal        Cardiovascular Exercise Tolerance: Good negative cardio ROS Normal cardiovascular exam Rhythm:regular Rate:Normal     Neuro/Psych negative neurological ROS  negative psych ROS   GI/Hepatic negative GI ROS, Neg liver ROS,   Endo/Other  negative endocrine ROS  Renal/GU negative Renal ROS  negative genitourinary   Musculoskeletal   Abdominal Normal abdominal exam  (+)   Peds  Hematology negative hematology ROS (+)   Anesthesia Other Findings Past Medical History: No date: Bladder stone No date: Carotid artery calcification No date: Cervical stenosis of spine No date: High cholesterol No date: History of kidney stones 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 No date: Tobacco use Past Surgical History: No date: TONSILLECTOMY BMI    Body Mass Index: 24.83 kg/m     Reproductive/Obstetrics negative OB ROS                            Anesthesia Physical  Anesthesia Plan  ASA: 2  Anesthesia Plan: General ETT   Post-op Pain Management: Minimal or no pain anticipated   Induction: Intravenous  PONV Risk Score and Plan: 2 and Ondansetron and Dexamethasone  Airway Management Planned: Oral ETT  Additional Equipment:   Intra-op Plan:   Post-operative Plan: Extubation in OR  Informed Consent: I have reviewed the patients History and Physical, chart, labs and discussed the procedure including the risks, benefits and alternatives for the proposed anesthesia with the patient or  authorized representative who has indicated his/her understanding and acceptance.     Dental Advisory Given  Plan Discussed with: CRNA  Anesthesia Plan Comments:        Anesthesia Quick Evaluation

## 2021-09-23 NOTE — H&P (Signed)
   09/23/21 8:29 AM   Victor Mathews [birth year] [id number]  CC: Left ureteral stone  HPI: 56 year old male who presented with dysuria and was found of a 1.5 cm prostatic urethral stone as well as a 2.5 cm left mid ureteral stone with hydronephrosis and some left renal atrophy.  On 09/09/2021 he underwent cystolitholapaxy and fragmentation of approximately 50% of the left mid ureteral stone, presents today for staged second look ureteroscopy to clear residual left ureteral stone burden   PMH: Past Medical History:  Diagnosis Date   Bladder stone    Carotid artery calcification    Cervical stenosis of spine    High cholesterol    History of kidney stones    Secondary gout    Tobacco use     Surgical History: Past Surgical History:  Procedure Laterality Date   CYSTOSCOPY WITH LITHOLAPAXY N/A 09/09/2021   Procedure: CYSTOSCOPY WITH LITHOLAPAXY;  Surgeon: Sondra Come, MD;  Location: [facility];  Service: Urology;  Laterality: N/A;   CYSTOSCOPY/URETEROSCOPY/HOLMIUM LASER/STENT PLACEMENT Left 09/09/2021   Procedure: CYSTOSCOPY/URETEROSCOPY/RETROGRADE PYLEOGRAM/HOLMIUM LASER/STENT PLACEMENT;  Surgeon: Sondra Come, MD;  Location: [facility];  Service: Urology;  Laterality: Left;   TONSILLECTOMY       Family History: History reviewed. No pertinent family history.  Social History:  reports that he has been smoking cigarettes. He has been smoking an average of 1 pack per day. He has been exposed to tobacco smoke. He has never used smokeless tobacco. He reports current alcohol use. He reports that he does not use drugs.  Physical Exam: BP 115/82   Pulse 60   Temp (!) 97.2 F (36.2 C) (Temporal)   Resp 16   Ht 5\' 10"  (1.778 m)   Wt 78.5 kg   SpO2 97%   BMI 24.83 kg/m    Constitutional:  Alert and oriented, No acute distress. Cardiovascular: Regular rate and rhythm Respiratory: Clear to auscultation bilaterally GI: Abdomen is soft, nontender, nondistended, no abdominal  masses   Laboratory Data: Urine culture 9/1 no growth  Assessment & Plan:   56 year old male who originally presented with dysuria and urinary symptoms and found to have a 1.5 cm prostatic urethral stone in addition to a 2.5 cm left mid ureteral stone.  Previously underwent cystolitholapaxy and removal of approximately 50% of the left mid ureteral stone, but impacted stone and large size necessitated second look staged ureteroscopy.  We specifically discussed the risks ureteroscopy including bleeding, infection/sepsis, stent related symptoms including flank pain/urgency/frequency/incontinence/dysuria, ureteral injury, inability to access stone, or need for staged or additional procedures.  We also discussed the risk of ureteral stricture in the setting of likely long-term left-sided impacted ureteral stone and need for follow-up renal ultrasound.  Left ureteroscopy, laser lithotripsy, stent change  54, MD 09/23/2021  [facility] [address] [place], [place] [phone]

## 2021-09-26 ENCOUNTER — Encounter: Payer: Self-pay | Admitting: Urology

## 2021-09-26 NOTE — Anesthesia Postprocedure Evaluation (Signed)
Anesthesia Post Note  Patient: Victor Mathews  Procedure(s) Performed: CYSTOSCOPY/URETEROSCOPY/HOLMIUM LASER/STENT EXCHANGE (Left) CYSTOSCOPY WITH RETROGRADE PYELOGRAM  Patient location during evaluation: PACU Anesthesia Type: General Level of consciousness: awake and alert Pain management: pain level controlled Vital Signs Assessment: post-procedure vital signs reviewed and stable Respiratory status: spontaneous breathing, nonlabored ventilation and respiratory function stable Cardiovascular status: blood pressure returned to baseline and stable Postop Assessment: no apparent nausea or vomiting Anesthetic complications: no   No notable events documented.   Last Vitals:  Vitals:   09/23/21 1045 09/23/21 1054  BP: 98/66 108/73  Pulse: 61 (!) 50  Resp: 11 16  Temp: 36.6 C (!) 36.3 C  SpO2: 97% 95%    Last Pain:  Vitals:   09/23/21 1054  TempSrc: Temporal  PainSc: 0-No pain                 Foye Deer

## 2021-10-11 ENCOUNTER — Ambulatory Visit (INDEPENDENT_AMBULATORY_CARE_PROVIDER_SITE_OTHER): Payer: BC Managed Care – PPO | Admitting: Urology

## 2021-10-11 ENCOUNTER — Other Ambulatory Visit: Payer: Self-pay | Admitting: *Deleted

## 2021-10-11 ENCOUNTER — Encounter: Payer: Self-pay | Admitting: Urology

## 2021-10-11 ENCOUNTER — Other Ambulatory Visit
Admission: RE | Admit: 2021-10-11 | Discharge: 2021-10-11 | Disposition: A | Discharge status: Home / Self Care | Payer: BC Managed Care – PPO | Attending: Urology | Admitting: Urology

## 2021-10-11 VITALS — BP 122/71 | HR 75 | Ht 70.0 in | Wt 175.0 lb

## 2021-10-11 DIAGNOSIS — N200 Calculus of kidney: Secondary | ICD-10-CM

## 2021-10-11 DIAGNOSIS — Z466 Encounter for fitting and adjustment of urinary device: Secondary | ICD-10-CM

## 2021-10-11 LAB — URINALYSIS, COMPLETE (UACMP) WITH MICROSCOPIC
RBC / HPF: >50 RBC/hpf (ref 0–5)
Squamous Epithelial / HPF: NONE SEEN (ref 0–5)

## 2021-10-11 MED ORDER — CEPHALEXIN 250 MG PO CAPS
500.0000 mg | ORAL_CAPSULE | Freq: Once | ORAL | Status: AC
  Administered 2021-10-11: 500 mg via ORAL

## 2021-10-11 NOTE — Progress Notes (Signed)
Cystoscopy Procedure Note:  Indication: Stent removal s/p 09/23/2021 left ureteroscopy/laser/stent for 2.5 cm impacted left mid ureteral stone  Keflex given for prophylaxis  After informed consent and discussion of the procedure and its risks, Victor Mathews was positioned and prepped in the standard fashion. Cystoscopy was performed with a flexible cystoscope. The stent was grasped with flexible graspers and removed in its entirety. The patient tolerated the procedure well.  Findings: Uncomplicated stent removal  Assessment and Plan: Follow up in 8 weeks with renal ultrasound to evaluate for silent hydronephrosis(expect some degree of chronic left hydro with longstanding obstruction), then yearly KUB  Billey Co, MD 10/11/2021

## 2021-10-11 NOTE — Patient Instructions (Signed)

## 2021-11-22 ENCOUNTER — Ambulatory Visit: Payer: BC Managed Care – PPO | Admitting: Urology
